# Patient Record
Sex: Female | Born: 1970 | Race: Black or African American | Hispanic: No | Marital: Married | Smoking: Never smoker
Health system: Southern US, Community
[De-identification: ages and names within clinical notes are randomized; demographics above are authoritative.]

## PROBLEM LIST (undated history)

## (undated) DIAGNOSIS — E785 Hyperlipidemia, unspecified: Secondary | ICD-10-CM

## (undated) DIAGNOSIS — D649 Anemia, unspecified: Secondary | ICD-10-CM

## (undated) DIAGNOSIS — T7840XA Allergy, unspecified, initial encounter: Secondary | ICD-10-CM

## (undated) DIAGNOSIS — F419 Anxiety disorder, unspecified: Secondary | ICD-10-CM

## (undated) DIAGNOSIS — F32A Depression, unspecified: Secondary | ICD-10-CM

## (undated) DIAGNOSIS — Z789 Other specified health status: Secondary | ICD-10-CM

## (undated) HISTORY — PX: COLONOSCOPY: SHX174

## (undated) HISTORY — DX: Anxiety disorder, unspecified: F41.9

## (undated) HISTORY — DX: Hyperlipidemia, unspecified: E78.5

## (undated) HISTORY — DX: Anemia, unspecified: D64.9

## (undated) HISTORY — DX: Depression, unspecified: F32.A

## (undated) HISTORY — PX: WISDOM TOOTH EXTRACTION: SHX21

## (undated) HISTORY — PX: CERVICAL CERCLAGE: SHX1329

## (undated) HISTORY — DX: Allergy, unspecified, initial encounter: T78.40XA

## (undated) HISTORY — DX: Other specified health status: Z78.9

---

## 2001-01-13 ENCOUNTER — Other Ambulatory Visit: Admission: RE | Admit: 2001-01-13 | Discharge: 2001-01-13 | Payer: Self-pay | Admitting: Obstetrics and Gynecology

## 2001-02-04 ENCOUNTER — Ambulatory Visit (HOSPITAL_COMMUNITY): Admission: RE | Admit: 2001-02-04 | Discharge: 2001-02-04 | Payer: Self-pay | Admitting: Obstetrics and Gynecology

## 2001-08-03 ENCOUNTER — Inpatient Hospital Stay (HOSPITAL_COMMUNITY): Admission: AD | Admit: 2001-08-03 | Discharge: 2001-08-06 | Payer: Self-pay | Admitting: Obstetrics and Gynecology

## 2001-09-14 ENCOUNTER — Other Ambulatory Visit: Admission: RE | Admit: 2001-09-14 | Discharge: 2001-09-14 | Payer: Self-pay | Admitting: Obstetrics and Gynecology

## 2003-02-21 ENCOUNTER — Other Ambulatory Visit: Admission: RE | Admit: 2003-02-21 | Discharge: 2003-02-21 | Payer: Self-pay | Admitting: Obstetrics and Gynecology

## 2004-11-05 ENCOUNTER — Ambulatory Visit: Payer: Self-pay | Admitting: Internal Medicine

## 2006-07-17 ENCOUNTER — Ambulatory Visit: Payer: Self-pay | Admitting: Internal Medicine

## 2006-08-06 ENCOUNTER — Ambulatory Visit: Payer: Self-pay | Admitting: Gastroenterology

## 2006-09-10 ENCOUNTER — Ambulatory Visit: Payer: Self-pay | Admitting: Gastroenterology

## 2007-08-10 ENCOUNTER — Ambulatory Visit: Payer: Self-pay | Admitting: Internal Medicine

## 2007-08-10 DIAGNOSIS — Z8719 Personal history of other diseases of the digestive system: Secondary | ICD-10-CM

## 2007-08-11 ENCOUNTER — Encounter: Payer: Self-pay | Admitting: Internal Medicine

## 2007-08-11 LAB — CONVERTED CEMR LAB: GC Probe Amp, Urine: NEGATIVE

## 2007-08-12 LAB — CONVERTED CEMR LAB
Cholesterol: 170 mg/dL (ref 0–200)
Eosinophils Absolute: 0.1 10*3/uL (ref 0.0–0.6)
Eosinophils Relative: 1.2 % (ref 0.0–5.0)
HCT: 33.7 % — ABNORMAL LOW (ref 36.0–46.0)
Hemoglobin: 11.8 g/dL — ABNORMAL LOW (ref 12.0–15.0)
Lymphocytes Relative: 24.4 % (ref 12.0–46.0)
MCV: 88.4 fL (ref 78.0–100.0)
Neutro Abs: 4.3 10*3/uL (ref 1.4–7.7)
Neutrophils Relative %: 70.8 % (ref 43.0–77.0)
Transferrin: 288.6 mg/dL (ref >=212.0)
WBC: 6.1 10*3/uL (ref 4.5–10.5)

## 2008-02-06 ENCOUNTER — Ambulatory Visit: Payer: Self-pay | Admitting: Family Medicine

## 2009-01-12 ENCOUNTER — Ambulatory Visit: Payer: Self-pay | Admitting: Internal Medicine

## 2009-08-22 ENCOUNTER — Ambulatory Visit: Payer: Self-pay | Admitting: Internal Medicine

## 2009-09-07 ENCOUNTER — Ambulatory Visit: Payer: Self-pay | Admitting: Internal Medicine

## 2009-09-12 ENCOUNTER — Encounter (INDEPENDENT_AMBULATORY_CARE_PROVIDER_SITE_OTHER): Payer: Self-pay | Admitting: *Deleted

## 2009-09-12 LAB — CONVERTED CEMR LAB
Basophils Relative: 0.5 % (ref 0.0–3.0)
Calcium: 8.7 mg/dL (ref 8.4–10.5)
Chloride: 106 meq/L (ref 96–112)
Creatinine, Ser: 0.9 mg/dL (ref 0.4–1.2)
Eosinophils Relative: 2.3 % (ref 0.0–5.0)
Ferritin: 4.7 ng/mL — ABNORMAL LOW (ref 10.0–291.0)
GFR calc non Af Amer: 89.12 mL/min (ref >60)
Iron: 49 ug/dL (ref 42–145)
LDL Cholesterol: 101 mg/dL — ABNORMAL HIGH (ref 0–99)
Lymphocytes Relative: 29.4 % (ref 12.0–46.0)
MCV: 87.4 fL (ref 78.0–100.0)
Monocytes Relative: 7 % (ref 3.0–12.0)
Neutrophils Relative %: 60.8 % (ref 43.0–77.0)
RBC: 3.84 M/uL — ABNORMAL LOW (ref 3.87–5.11)
Total CHOL/HDL Ratio: 3
Triglycerides: 63 mg/dL (ref 0.0–149.0)
WBC: 4.5 10*3/uL (ref 4.5–10.5)

## 2010-02-21 ENCOUNTER — Encounter (INDEPENDENT_AMBULATORY_CARE_PROVIDER_SITE_OTHER): Payer: Self-pay | Admitting: *Deleted

## 2010-02-21 ENCOUNTER — Ambulatory Visit: Payer: Self-pay | Admitting: Internal Medicine

## 2010-02-21 LAB — CONVERTED CEMR LAB: Rapid Strep: POSITIVE

## 2010-02-23 ENCOUNTER — Ambulatory Visit: Payer: Self-pay | Admitting: Internal Medicine

## 2010-02-23 ENCOUNTER — Encounter (INDEPENDENT_AMBULATORY_CARE_PROVIDER_SITE_OTHER): Payer: Self-pay | Admitting: *Deleted

## 2010-05-24 ENCOUNTER — Ambulatory Visit: Payer: Self-pay | Admitting: Family Medicine

## 2010-08-23 ENCOUNTER — Ambulatory Visit: Payer: Self-pay | Admitting: Internal Medicine

## 2010-08-30 LAB — CONVERTED CEMR LAB
ALT: 28 units/L (ref 0–35)
Calcium: 8.7 mg/dL (ref 8.4–10.5)
Cholesterol: 197 mg/dL (ref 0–200)
Eosinophils Relative: 2.6 % (ref 0.0–5.0)
Folate: 15.4 ng/mL
GFR calc non Af Amer: 100.16 mL/min (ref >60)
HCT: 34.5 % — ABNORMAL LOW (ref 36.0–46.0)
HDL: 61.8 mg/dL (ref >39.00)
Hemoglobin: 11.9 g/dL — ABNORMAL LOW (ref 12.0–15.0)
LDL Cholesterol: 117 mg/dL — ABNORMAL HIGH (ref 0–99)
Lymphocytes Relative: 60.9 % — ABNORMAL HIGH (ref 12.0–46.0)
Lymphs Abs: 2.6 10*3/uL (ref 0.7–4.0)
Monocytes Relative: 14.3 % — ABNORMAL HIGH (ref 3.0–12.0)
Neutro Abs: 0.9 10*3/uL — ABNORMAL LOW (ref 1.4–7.7)
Potassium: 4.4 meq/L (ref 3.5–5.1)
Saturation Ratios: 18.4 % — ABNORMAL LOW (ref 20.0–50.0)
Sodium: 135 meq/L (ref 135–145)
TSH: 3.42 microintl units/mL (ref 0.35–5.50)
Transferrin: 318.3 mg/dL (ref 212.0–360.0)
VLDL: 18.4 mg/dL (ref 0.0–40.0)
WBC: 4.2 10*3/uL — ABNORMAL LOW (ref 4.5–10.5)

## 2011-01-22 NOTE — Letter (Signed)
Summary: Work Dietitian at [employer]  [address] [place], [place] [address]   Phone: [phone]  Fax: [phone]    Today's Date: February 23, 2010  Name of Patient: Betty Hines  The above named patient had a medical visit today. Please take this into consideration when reviewing the time away from work/school.    Special Instructions:  [  ] None  [  ] To be off the remainder of today, returning to the normal work / school schedule tomorrow.  [  ] To be off until the next scheduled appointment on ______________________.  [x Other __________patient can return to work today ______________________________________________________ ________________________________________________________________________   Sincerely yours,   Shary Decamp

## 2011-01-22 NOTE — Assessment & Plan Note (Signed)
Summary: RIGHT ARM TINGLING--PH   Vital Signs:  Patient profile:   40 year old female Height:      65 inches Weight:      177 pounds BMI:     29.56 Pulse rate:   78 / minute BP sitting:   120 / 74  (left arm)  Vitals Entered By: Betty Hines (May 24, 2010 11:18 AM) CC: R hand tingling and going up arm some numbness    History of Present Illness: 40 yo woman here today for tingling in her R finger tips.  starts in the hand and radiates up into the upper arm.  occuring daily.  started 2 months ago.  no weakness.  R handed.  most noticeable when pt is 'still'.  no known injury.  no routine lifting.  no hx of anything similar.  hasn't tried anything for relief.  worse w/ clenching fist or carrying groceries or laundry basket.  reports she will sleep w/ R hand bent awkwardly under her face/chin.  Allergies (verified): 1)  ! Pcn  Review of Systems      See HPI  Physical Exam  General:  alert and well-developed.   Head:  Normocephalic and atraumatic without obvious abnormalities. No apparent alopecia or balding.  symmetric Pulses:  +2 carotid, radial, DP Extremities:  no C/C/E Neurologic:  alert & oriented X3, cranial nerves II-XII intact, strength normal in all extremities, sensation intact to light touch, and DTRs symmetrical and normal.    (-) phalen's and tinnel's.  + DeQuervains testing   Impression & Recommendations:  Problem # 1:  NUMBNESS, HAND (ICD-782.0) Assessment New  pt's tingling and numbness most noticeable over thumb pad and in first finger.  (-) carpal tunnel testing.  + DeQuervains.  start NSAID to decrease inflammation.  remainder of neuro exam WNL- TIA/CVA unlikely given duration of sxs, intermittant pattern, and normal neuro exam.  will refer to ortho in case NSAIDs don't provide relief.  reviewed red flags.  Pt expresses understanding and is in agreement w/ this plan.  Orders: Orthopedic Referral (Ortho)  Complete Medication List: 1)  Naprosyn 500 Mg  Tabs (Naproxen) .Marland Kitchen.. 1 two times a day x7-10 days and then as needed.  take w/ food  Patient Instructions: 1)  Someone will call you with your ortho appt 2)  This appears to be a nerve irritation and should improve 3)  Take the Naprosyn as directed- take w/ food to avoid stomach upset 4)  Try and avoid carrying heavy things on the R or sleeping w/ your hand tucked up under 5)  Call with any questions or concerns- especially weakness, change in speech, facial droop, abrupt numbness- or go to the ER 6)  Hang in there! Prescriptions: NAPROSYN 500 MG TABS (NAPROXEN) 1 two times a day x7-10 days and then as needed.  take w/ food  #60 x 1   Entered and Authorized by:   Neena Rhymes MD   Signed by:   Neena Rhymes MD on 05/24/2010   Method used:   Electronically to        [facility] (retail)       [address]       [place], [place]  [address]       Ph: [phone]       Fax: [phone]   RxID:   [id number]

## 2011-01-22 NOTE — Assessment & Plan Note (Signed)
Summary: strep throat/kdc   Vital Signs:  Patient profile:   40 year old female Height:      65 inches Weight:      171.2 pounds Temp:     100.9 degrees F BP sitting:   122 / 80  Vitals Entered By: Shary Decamp (February 21, 2010 11:10 AM) CC: ST, fever, achy, chills, sweats   History of Present Illness: as above sick x 2 days   Current Medications (verified): 1)  None  Allergies (verified): 1)  ! Pcn  Past History:  Past Medical History: Reviewed history from 08/22/2009 and no changes required. no Birth Control -- husband had a vasectomy  Past Surgical History: Reviewed history from 08/10/2007 and no changes required. Caesarean section, x 2   Social History: Reviewed history from 08/22/2009 and no changes required. Married two children Never Smoked Alcohol use-yes (occasionally) Drug use-no Regular exercise-yes (1 hour 7d/wk) caffeine use - 1 cup daily  Review of Systems       no drooling  General:  no exposure to sick people . ENT:  (-) RN. Resp:  no cough no chest congestion. GI:  Denies diarrhea, nausea, and vomiting. GU:  Denies dysuria.  Physical Exam  General:  alert and well-developed.   Ears:  R ear normal and L ear normal.   Nose:  not congested Mouth:  throat is red, no discharge the uvula seems edematous and enlarged throat is otherwise symmetric no stridor or drooling noted Lungs:  normal respiratory effort, no intercostal retractions, no accessory muscle use, and normal breath sounds.   Heart:  normal rate, regular rhythm, and no murmur.     Impression & Recommendations:  Problem # 1:  PHARYNGITIS (ICD-462) strep + patient has pharyngitis and a swollen uvula no signs of respiratory distress at this point she is allergic to penicillin plan: clinda, prednisone, see instructions   Her updated medication list for this problem includes:    Clindamycin Hcl 300 Mg Caps (Clindamycin hcl) ..... One tablet by mouth 4 times a  day  Complete Medication List: 1)  Clindamycin Hcl 300 Mg Caps (Clindamycin hcl) .... One tablet by mouth 4 times a day 2)  Prednisone (pak) 10 Mg Tabs (Prednisone) .... 3 by mouth for two days, 2x2, 1x2  Other Orders: Rapid Strep (04540)  Patient Instructions: 1)  take your medications as prescribed 2)  if you feel difficulty breathing or if the  throat gets more swollen:  go to  the ER immediately 3)  come back in two days  for a checkup Prescriptions: PREDNISONE (PAK) 10 MG TABS (PREDNISONE) 3 by mouth for two days, 2x2, 1x2  #12 x 0   Entered and Authorized by:   Elita Quick E. Paz MD   Signed by:   Nolon Rod. Paz MD on 02/21/2010   Method used:   Print then Give to Patient   RxID:   [id number] CLINDAMYCIN HCL 300 MG CAPS (CLINDAMYCIN HCL) one tablet by mouth 4 times a day  #40 x 0   Entered and Authorized by:   Nolon Rod. Paz MD   Signed by:   Nolon Rod. Paz MD on 02/21/2010   Method used:   Print then Give to Patient   RxID:   [id number]   Laboratory Results    Other Tests  Rapid Strep: positive  Kit Test Internal QC: Positive   (Normal Range: Negative)

## 2011-01-22 NOTE — Assessment & Plan Note (Signed)
Summary: 2 DAY CHECK//PH PER DR. PAZ   Vital Signs:  Patient profile:   40 year old female Height:      65 inches Weight:      171 pounds Temp:     97.9 degrees F BP sitting:   140 / 80  Vitals Entered By: Shary Decamp (February 23, 2010 11:32 AM) CC: feeling better   History of Present Illness: follow-up from last office visit, she was diagnosed with strep, her uvula  was edematous  Current Medications (verified): 1)  Clindamycin Hcl 300 Mg Caps (Clindamycin Hcl) .... One Tablet By Mouth 4 Times A Day 2)  Prednisone (Pak) 10 Mg Tabs (Prednisone) .... 3 By Mouth For Two Days, 2x2, 1x2  Allergies (verified): 1)  ! Pcn  Review of Systems       feels great no difficult to swallow or breathing  Physical Exam  General:  alert and well-developed.   Mouth:  throat is not reddened more,  uvula  is back to normal, no difficult to swallow or breathing noted   Impression & Recommendations:  Problem # 1:  PHARYNGITIS (ICD-462) improved continue with medications as planned,call if  problems Her updated medication list for this problem includes:    Clindamycin Hcl 300 Mg Caps (Clindamycin hcl) ..... One tablet by mouth 4 times a day  Complete Medication List: 1)  Clindamycin Hcl 300 Mg Caps (Clindamycin hcl) .... One tablet by mouth 4 times a day 2)  Prednisone (pak) 10 Mg Tabs (Prednisone) .... 3 by mouth for two days, 2x2, 1x2

## 2011-01-22 NOTE — Letter (Signed)
Summary: Work Dietitian at [employer]  [address] [place], [place] [address]   Phone: [phone]  Fax: [phone]    Today's Date: February 21, 2010  Name of Patient: Betty Hines  The above named patient had a medical visit today  Please take this into consideration when reviewing the time away from work/school.    Special Instructions:  [  ] None  [  x] To be off the remainder of today, returning to the normal work / school schedule Fri 02/23/10  [  ] To be off until the next scheduled appointment on ______________________.  [  ] Other ________________________________________________________________ ________________________________________________________________________   Sincerely yours,   Shary Decamp

## 2011-01-22 NOTE — Assessment & Plan Note (Signed)
Summary: cpx/cbs   Vital Signs:  Patient profile:   40 year old female Weight:      178.25 pounds Pulse rate:   80 / minute Pulse rhythm:   regular BP sitting:   122 / 76  (left arm) Cuff size:   regular  Vitals Entered By: Army Fossa CMA (August 23, 2010 10:33 AM) CC: CPX: fasting Comments Declines Flu Shot Not Had mamo Had PAP yesterday   History of Present Illness: CPX sees gyn  occationally her far vision is blurred Still having problems with controlling her weight  Current Medications (verified): 1)  None  Allergies: 1)  ! Pcn  Past History:  Past Medical History: Reviewed history from 08/22/2009 and no changes required. no Birth Control -- husband had a vasectomy  Past Surgical History: Reviewed history from 08/10/2007 and no changes required. Caesarean section, x 2   Family History: patient is adopted, she does know that some of her blood  related does know that she has a FH of colon cancer (age of dx?)  Social History: Married two children Never Smoked Alcohol use-yes (occasionally) Drug use-no Regular exercise- no diet-- "needs improvment" caffeine use - 1 cup daily occupation: HR manager   Review of Systems General:  Denies fatigue, fever, and weight loss. CV:  Denies chest pain or discomfort and swelling of feet. Resp:  Denies cough and shortness of breath. GI:  Denies bloody stools, diarrhea, nausea, and vomiting. GU:  Denies dysuria and hematuria; perods q 28 days, every other month is heavy  (very havy x 2 days, may last 7 days). Psych:  ++stress , occasionally depression, doesn't feel  needs treatment .  Physical Exam  General:  alert and well-developed.   Neck:  no masses and no thyromegaly.   Lungs:  normal respiratory effort, no intercostal retractions, no accessory muscle use, and normal breath sounds.   Heart:  normal rate, regular rhythm, and no murmur.   Abdomen:  soft, non-tender, no distention, no masses, no  guarding, and no rigidity.   Extremities:  no  lower extremity edema Neurologic:  alert & oriented X3, strength normal in all extremities, and gait normal.   Psych:  Cognition and judgment appear intact. Alert and cooperative with normal attention span and concentration.  not anxious appearing and not depressed appearing.  not anxious appearing and not depressed appearing.     Impression & Recommendations:  Problem # 1:  PREVENTIVE HEALTH CARE (ICD-V70.0) Td 07-2009 declined flu shot, explained benefits  labs  had a cscope----will get records   problems w/  occ. blurred vision, will see the eye doctor  problems loosing wt:--discussed healthy habits, calorie counting , WW?   h/o mild anemia---labs   Orders: Venipuncture (60630) TLB-BMP (Basic Metabolic Panel-BMET) (80048-METABOL) TLB-CBC Platelet - w/Differential (85025-CBCD) TLB-Lipid Panel (80061-LIPID) TLB-TSH (Thyroid Stimulating Hormone) (84443-TSH) TLB-ALT (SGPT) (84460-ALT) TLB-AST (SGOT) (84450-SGOT) TLB-IBC Pnl (Iron/FE;Transferrin) (83550-IBC) TLB-B12 + Folate Pnl (16010_93235-T73/UKG)  Other Orders: Specimen Handling (25427)  Patient Instructions: 1)  Please schedule a follow-up appointment in 1 year.         Appended Document: cpx/cbs Had  a colonoscopy Sept 2007, next due Sept 2012- with Dr. Christella Hartigan.

## 2011-05-07 NOTE — Assessment & Plan Note (Signed)
[facility]                                 ON-CALL NOTE   NAME:Betty Hines, Betty Hines                      MRN:          [id number]  DATE:02/06/2008                            DOB:          06-18-1971    Phone number [phone].   Patient of Dr. Drue Novel   The patient is concerned she might have strep throat, go to Saturday  clinic for eval at 9 a.m.     Jeffrey A. Tawanna Cooler, MD  Electronically Signed    JAT/MedQ  DD: 02/06/2008  DT: 02/08/2008  Job #: [id number]

## 2011-05-10 NOTE — Discharge Summary (Signed)
[facility]  Patient:    Betty Hines, Betty Hines Visit Number: [id number] MRN: [id number]          Service Type: OBS Location: 910A [id number] [id number] Attending Physician:  Lenoard Aden Dictated by:   Lenoard Aden, M.D. Admit Date:  08/03/2001 Discharge Date: 08/06/2001                             Discharge Summary  HOSPITAL COURSE:              The patient underwent an uncomplicated repeat low segment transverse cesarean section on August 03, 2001 without complications. Postoperative course was uncomplicated. Hemoglobin was 9.1 with hematocrit of 25.4. She tolerated a regular diet well and ambulated without difficulty. She was discharged to home on postoperative day #3.  DISCHARGE MEDICATIONS:        1. Motrin 600 mg one p.o. q.6h. p.r.n. pain.                               2. Tylox one p.o. q.4-6h. p.r.n. pain.                               3. Prenatal vitamins and iron supplementation.  DISCHARGE INSTRUCTIONS:       Discharge teaching was done.  DISCHARGE FOLLOWUP:           The patient is to follow up in the office in four to six weeks. Dictated by:   Lenoard Aden, M.D. Attending Physician:  Lenoard Aden DD:  08/23/01 TD:  08/23/01 Job: [id number]

## 2011-05-10 NOTE — H&P (Signed)
[facility]   Patient:    Betty Hines, Betty Hines                    MRN: [id number] Adm. Date:  [id number] Attending:  Lenoard Aden                         History and Physical  CHIEF COMPLAINT:              Previous cesarean section, for repeat.  HISTORY OF PRESENT ILLNESS:   The patient is a 40 year old, African-American female, G5, P44, EDD August 04, 2001, at 39-6/7 weeks for elective repeat C-section.  PAST MEDICAL HISTORY:         TAB x3. C-section x1. Cerclage x2.  ALLERGIES:                    Allergies to PENICILLIN.  MEDICATIONS:                  Prenatal vitamins.  FAMILY HISTORY:               Noncontributory.  SOCIAL HISTORY:               Noncontributory.  Father of the baby noted to have sickle cell trait.  PHYSICAL EXAMINATION:  GENERAL:                      On physical examination, she is a well-developed, well-nourished African-American female in no apparent distress.  HEENT:                        Normal.  LUNGS:                        Clear.  HEART:                        Regular rate and rhythm.  ABDOMEN:                      Soft, gravida, nontender.  Estimated fetal weight 9-1/2 to 10 pounds.  PELVIC:                       Cervix is fingertip, long, vertex, posterior.  EXTREMITIES:                  Reveal no cords.  NEUROLOGICAL:                 Examination is nonfocal.  IMPRESSION:                   1. Term intrauterine pregnancy.                               2. Presumed large for gestational age.                               3. Previous cesarean section, for repeat.  PLAN:                         Proceed with elective repeat C-section. Risks of anesthesia, infection, bleeding, injury to abdominal organs, and need for repair is discussed.  Delayed versus  immediate complications to include bowel and bladder range are noted.  The patient acknowledges and desires to proceed. DD:  08/03/01 TD:  08/03/01 Job:  [id number]

## 2011-05-10 NOTE — H&P (Signed)
[facility] [place]  Patient:    Betty Hines, Betty Hines                    MRN: [id number] Adm. Date:  [id number] Attending:  Lenoard Aden Dictator:   Lenoard Aden, M.D. [facility]   History and Physical  CHIEF COMPLAINT:              History of incompetent cervix.  HISTORY OF PRESENT ILLNESS:   The patient is a 40 year old black female G4, P1-0-2-1, with a history of an emergent cervical cerclage placement in her prior pregnancy due to progressive cervical change at 20 weeks, who now presents for elective cerclage placement at 14 weeks.  PAST MEDICAL HISTORY:         Remarkable for no medial or surgical hospitalizations except for pregnancy related.  She had a C-section for macrosomia in 1999 and also had cerclage placement in September 1998.  Her husband has sickle cell trait.  The patient is negative for sickle cell trait.  OBSTETRICAL HISTORY:          Remarkable for a first trimester D&E in March 1991, with a repeat D&C secondary to hemorrhage, elective abortion in 1995. She had a previous C-section in 1999 which was uncomplicated.  FAMILY HISTORY:               Unknown due to her adopted status.  Her husband has a history of G6PD deficiency and sickle cell trait.  LABORATORY DATA:              Current lab data includes a blood type of A negative, Rh antibody negative, sickle cell trait negative, VDRL nonreactive, hepatitis B surface antigen negative, HIV nonreactive.  PHYSICAL EXAMINATION:  GENERAL:                      She is a well-developed, well-nourished black female in no apparent distress.  HEENT:                        Normal.  LUNGS:                        Clear.  HEART:                        Regular rate and rhythm.  ABDOMEN:                      Soft, nontender.  PELVIC EXAM:                  Reveals a cervical length of about 4 cm with a gravid uterus to 14-15 weeks and fetal heart tones  noted.  EXTREMITIES:                  Reveals no cords.  NEUROLOGIC EXAM:              Nonfocal.  IMPRESSION:                   1. A 14+ intrauterine pregnancy.                               2. History of cervical incompetence, status  post emergency cerclage placement.                               3. Rh negative.  PLAN:                         Proceed with McDonald elective cervical cerclage.  The risks of anesthesia, infection, bleeding, pregnancy loss rates of less than 5% discussed.  The patient acknowledges and desires to proceed. DD:  02/04/01 TD:  02/04/01 Job: [id number]

## 2011-05-10 NOTE — Op Note (Signed)
[facility] [place]  Patient:    Betty Hines, Betty Hines                    MRN: [id number] Proc. Date: 08/03/01 Adm. Date:  [id number] Attending:  Lenoard Aden                           Operative Report  PREOPERATIVE DIAGNOSIS:       39-week intrauterine pregnancy.  Presume LGA. Previous cesarean section.  POSTOPERATIVE DIAGNOSIS:      39-week intrauterine pregnancy.  Presume LGA. Previous cesarean section.  OPERATION:                    Repeat low transverse cesarean section.  SURGEON:                      Lenoard Aden, M.D.  ASSISTANT:                    Marina Gravel, M.D.  ANESTHESIA:                   Spinal.  ESTIMATED BLOOD LOSS:         1000 cc.  COMPLICATIONS:                None.  DRAINS:                       Foley.  COUNTS:                       Correct.  The patient to the recovery room in good condition.  DESCRIPTION OF PROCEDURE:     After being apprised of the risks of anesthesia, infection, bleeding, injury to abdominal organs with need for repair, the patient was brought to the operating room where she was administered spinal anesthetic without complications, and prepped and draped in the usual sterile fashion.  Foley catheter placed.  After achieving adequate anesthesia, dilute Marcaine solution 10 cc placed in the previous incision and incision made with the scalpel, carried down to the fascia, which was nicked in the midline and opened transversely using Mayo scissors.  The rectus muscles were dissected sharply in the midline.  Peritoneal entry done sharply.  Bladder blade placed. The bladder flap is sharply dissected off the lower uterine segment, uterine incision is incised.  Clear fluid noted.  Delivery of a fullterm living female from the left occipitotransverse position 10 pounds even, handed to pediatricians in attendance.  Apgars 8 and 9 noted.  The placenta delivered manually intact.  Three vessel cord noted.  Cervix  dilated using sponge forcep. Uterus curetted using a dry lap pack and exteriorized.  Normal tubes and ovaries noted.  Uterine incision closed with 0 Monocryl in continuous running fashion with placement of a second imbricating layer.  There is bleeding from the left lower uterine segment.  An OLeary stitch is placed using a 0 Monocryl suture and good hemostasis is achieved.  Inspection notes no evidence of a hematoma nor extension into the broad ligament.  This is reinspected at the end of the second imbricating layer which is placed using a 0 Monocryl suture.  Rectus muscles reapproximated in the lower midline using a 0 Monocryl suture and reinspection reveals no evidence of hematoma formation at the left portion of the lower uterine incision.  At this time  pericolic gutters are irrigated.  All blood clot subsequently removed.  The fascia then closed using #1 Vicryl in continuous running fashion.  Skin closed using staples.  Good hemostasis noted.  The patient tolerated the procedure well and was transferred to the recovery room in good condition. DD:  08/03/01 TD:  08/03/01 Job: [id number]

## 2011-05-10 NOTE — Op Note (Signed)
[facility] of   Patient:    Betty Hines, Betty Hines                    MRN: [id number] Proc. Date: 02/04/01 Adm. Date:  [id number] Attending:  Lenoard Aden CC:         [facility]   Operative Report  PREOPERATIVE DIAGNOSES:       1. Intrauterine pregnancy at 14-1/2 weeks.                               2. History of cervical incompetence.  POSTOPERATIVE DIAGNOSES:      1. Intrauterine pregnancy at 14-1/2 weeks.                               2. History of cervical incompetence.  PROCEDURE:                    McDonalds cervical cerclage.  SURGEON:                      Lenoard Aden, M.D.  ASSISTANT:                    Marina Gravel, M.D.  ANESTHESIA:                   Spinal by Pamalee Leyden.  ESTIMATED BLOOD LOSS:         Less than 50 cc.  COMPLICATIONS:                None.  DRAINS:                       None.  COUNTS:                       Correct.  DISPOSITION:                  To recovery in good condition.  DESCRIPTION OF PROCEDURE:     After being apprised of the risks of anesthesia, infection, bleeding and the possible risks of miscarriage being less than 5%, consent was signed.  The patient was brought to the operating room, where she was administered a spinal anesthesia without complications.  Her feet were placed in the yellow fin stirrups.  The patient was prepped and draped in the usual sterile fashion and catheterized until the bladder was empty. Examination under anesthesia revealed a 3.5 to 4 cm patulous cervix.  Fetal heart tones were heard preprocedure.  At this time, a #5 Ethibond suture was used to place a McDonald cervical cerclage, taking purchase of the cervix with small ring forceps and placing the suture at the cervicovaginal junction at the level of the internal os from 1 to 11 oclock, then 11 to 8 oclock, 7 oclock to 4 oclock and 4 oclock back to 1 oclock.  :The suture was tied down over a Prolene tie.  Both sutures  were tied.  Good purchase of the cervix was noted.  No tearing and no pulling through of the tissue was appreciated. The cervical os appeared closed internally and externally.  Good hemostasis was achieved.  All instruments were removed.  The patient tolerated the procedure well and was transferred to the recovery room in good condition. DD:  02/04/01 TD:  02/04/01  Job: [id number]

## 2011-05-10 NOTE — Assessment & Plan Note (Signed)
[facility]                           GASTROENTEROLOGY OFFICE NOTE   NAME:Betty Hines                      MRN:          [id number]  DATE:08/06/2006                            DOB:          [birth year]    REFERRED BY:  Dr. Willow Ora.   REASON FOR REFERRAL:  Dr. Drue Novel asked me to evaluate Betty Hines in  consultation regarding strong family history of colon cancer.   HISTORY OF PRESENT ILLNESS:  Betty Hines is a very pleasant 40 year old  woman who has had several second degree relatives die from colon cancer.  Specifically on her mother's side she has had three aunts and one uncle all  pass away because of colon cancer. She believes this was in their early 73s.  She herself has had minor intermittent bright red blood per rectum and is  bothered by rather chronic constipation. She has tried fiber supplements  periodically but not on a daily basis. She has tried cleansing enemas, and  none have seemed to help her constipation.   REVIEW OF SYSTEMS:  Noted for slow 5 pound weight loss in the past year. The  rest of the Review of Systems is essentially normal and is available on her  nursing intake sheet.   PAST MEDICAL HISTORY:  C-section x2.   CURRENT MEDICATIONS:  Probiotic, multivitamin, vitamin C, fish oil.   ALLERGIES:  PENICILLIN.   SOCIAL HISTORY:  Married with two children. 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 Nonsmoker, nondrinker.   FAMILY HISTORY:  Mother with diabetes. Mother with heart disease. Three  maternal aunts and one maternal uncle with colon cancer. Other maternal  aunts with colon polyps.   PHYSICAL EXAMINATION:  VITAL SIGNS:  5 feet 4-1/2 inches, 169 pounds, blood  pressure 106/58, pulse 72.  CONSTITUTIONAL:  Generally well-appearing.  NEUROLOGICAL:  Alert and in no distress.  HEENT:  Extraocular movements intact, mouth, oropharynx moist. No lesions.  NECK:  Supple, no  lymphadenopathy.  CARDIOVASCULAR:  Heart regular rate and rhythm.  LUNGS:  Clear to auscultation bilaterally.  ABDOMEN:  Soft, nontender, nondistended. Normal bowel sounds.  EXTREMITIES:  No lower extremity edema.  SKIN:  No rash, no lesions on visible extremities.   ASSESSMENT AND PLAN:  A 40 year old woman with strong family history of  colon cancer, intermittent bright red blood per rectum. Her minor rectal  bleeding is likely hemorrhoidal. It does seem to be around the times of  constipation. That being said for that reason we should pursue a full  screening colonoscopy plus she has a very strong family history for colon  cancer that has led to the death of four of her mom's siblings. She had  recent labs showing that she is not anemic, and she has a normal basic  metabolic profile so I see no reason to do any further lab testing or  imaging studies. We will proceed with full colonoscopy at Ms. Seigler'  convenience.  Rachael Fee, MD   DPJ/MedQ  DD:  08/06/2006  DT:  08/06/2006  Job #:  [id number]   cc:   Willow Ora, MD

## 2011-08-27 ENCOUNTER — Encounter: Payer: Self-pay | Admitting: Internal Medicine

## 2011-08-27 ENCOUNTER — Ambulatory Visit (INDEPENDENT_AMBULATORY_CARE_PROVIDER_SITE_OTHER): Payer: PRIVATE HEALTH INSURANCE | Admitting: Internal Medicine

## 2011-08-27 DIAGNOSIS — Z0000 Encounter for general adult medical examination without abnormal findings: Secondary | ICD-10-CM | POA: Insufficient documentation

## 2011-08-27 LAB — CBC WITH DIFFERENTIAL/PLATELET
Basophils Relative: 0.3 % (ref 0.0–3.0)
Eosinophils Relative: 2.4 % (ref 0.0–5.0)
HCT: 34.1 % — ABNORMAL LOW (ref 36.0–46.0)
Hemoglobin: 11.4 g/dL — ABNORMAL LOW (ref 12.0–15.0)
MCV: 91.9 fl (ref 78.0–100.0)
Monocytes Absolute: 0.5 10*3/uL (ref 0.1–1.0)
Neutrophils Relative %: 62 % (ref 43.0–77.0)
RBC: 3.71 Mil/uL — ABNORMAL LOW (ref 3.87–5.11)
WBC: 5.7 10*3/uL (ref 4.5–10.5)

## 2011-08-27 LAB — COMPREHENSIVE METABOLIC PANEL
ALT: 16 U/L (ref 0–35)
AST: 19 U/L (ref 0–37)
Albumin: 3.8 g/dL (ref 3.5–5.2)
Alkaline Phosphatase: 43 U/L (ref 39–117)
Glucose, Bld: 91 mg/dL (ref 70–99)
Potassium: 3.9 mEq/L (ref 3.5–5.1)
Sodium: 138 mEq/L (ref 135–145)
Total Bilirubin: 0.7 mg/dL (ref 0.3–1.2)
Total Protein: 7.2 g/dL (ref 6.0–8.3)

## 2011-08-27 LAB — LIPID PANEL
Total CHOL/HDL Ratio: 3
Triglycerides: 50 mg/dL (ref 0.0–149.0)

## 2011-08-27 LAB — TSH: TSH: 2.78 u[IU]/mL (ref 0.35–5.50)

## 2011-08-27 NOTE — Assessment & Plan Note (Addendum)
Td  8 2010 Gyn Dr Rosemary Holms Cscope 2007, normal, next per GI  (pt plans to contact them, + FH)  Diet-exercise discussed Labs including a B12 Ca and vit D OTC  encouraged

## 2011-08-27 NOTE — Progress Notes (Signed)
  Subjective:    Patient ID: Betty Hines, female    DOB: 1971-04-19, 40 y.o.   MRN: [id number]  HPI Physical exam, doing well.  Past Medical History  Diagnosis Date  . No known problems     Past Surgical History  Procedure Date  . Cesarean section     x2    Family History: patient is adopted, she does know that some of her blood  related does know that she has a FH of colon cancer (age of dx?)  Social History: Married but separated, two children Never Smoked Alcohol -- rarely  Drug use-no Regular exercise-- active, exercise ~ 3/week diet-- good and bad days  occupation: HR manager   Review of Systems Denies any nausea, vomiting, diarrhea, no blood in the stools. Denies any dysuria or gross hematuria. Emotionally doing well. Exercises frequently, no exertional chest pain , has had some dyspnea on exertion but has noted that is improving as she gets more active. Bilateral-upper chest pain while doing sit ups one time, but again no exertional chest pain.    Objective:   Physical Exam  Constitutional: She is oriented to person, place, and time. She appears well-developed and well-nourished. No distress.  HENT:  Head: Normocephalic and atraumatic.  Neck:       Small, symmetric, nontender thyroid gland  Cardiovascular: Normal rate, regular rhythm and normal heart sounds.   No murmur heard. Pulmonary/Chest: Effort normal and breath sounds normal. No respiratory distress. She has no wheezes. She has no rales.  Abdominal: Soft. Bowel sounds are normal. She exhibits no distension. There is no tenderness. There is no rebound and no guarding.  Musculoskeletal: She exhibits no edema.  Neurological: She is alert and oriented to person, place, and time.  Skin: She is not diaphoretic.  Psychiatric: She has a normal mood and affect. Her behavior is normal. Judgment and thought content normal.          Assessment & Plan:

## 2011-09-04 ENCOUNTER — Encounter: Payer: Self-pay | Admitting: Gastroenterology

## 2012-07-23 ENCOUNTER — Other Ambulatory Visit: Payer: Self-pay | Admitting: Obstetrics and Gynecology

## 2012-07-23 DIAGNOSIS — Z1231 Encounter for screening mammogram for malignant neoplasm of breast: Secondary | ICD-10-CM

## 2012-07-28 ENCOUNTER — Ambulatory Visit (INDEPENDENT_AMBULATORY_CARE_PROVIDER_SITE_OTHER): Payer: PRIVATE HEALTH INSURANCE

## 2012-07-28 DIAGNOSIS — Z1231 Encounter for screening mammogram for malignant neoplasm of breast: Secondary | ICD-10-CM

## 2012-09-11 ENCOUNTER — Encounter: Payer: Self-pay | Admitting: Gastroenterology

## 2012-09-21 ENCOUNTER — Encounter: Payer: Self-pay | Admitting: Internal Medicine

## 2012-09-21 ENCOUNTER — Ambulatory Visit (INDEPENDENT_AMBULATORY_CARE_PROVIDER_SITE_OTHER): Payer: PRIVATE HEALTH INSURANCE | Admitting: Internal Medicine

## 2012-09-21 VITALS — BP 118/76 | HR 68 | Temp 98.7°F | Ht 65.0 in | Wt 175.0 lb

## 2012-09-21 DIAGNOSIS — Z0000 Encounter for general adult medical examination without abnormal findings: Secondary | ICD-10-CM

## 2012-09-21 NOTE — Progress Notes (Signed)
  Subjective:    Patient ID: Betty Hines, female    DOB: 04-Jan-1971, 41 y.o.   MRN: [id number]  HPI CPX  Past medical history None  Past surgical history C sections x 2   Family History: patient is adopted, she does know that some of her blood  related does know that she has a FH of colon cancer (age of dx?)  Social History: Married but separated, two children Never Smoked Alcohol -- rarely   Drug use-no Regular exercise-- active, exercise ~ 5/week diet-- good and bad days   occupation: HR manager    Review of Systems In general doing well. No chest pain shortness or breath No nausea, vomiting, diarrhea or blood in the stools. A week ago developed a cough, mostly dry, increase when she tries to take a deep breath, associated with mild nasal discharge. Denies fever chills or sore throat. Some B ear discomfort. Emotionally  is doing okay, from time to time she gets upset but again in general doing well. Periods are regular and actually less heavy than before    Objective:   Physical Exam General -- alert, well-developed, and well-nourished.   Neck --no thyromegaly , no LADs HEENT -- TMs normal, throat w/o redness, face symmetric and not tender to palpation, nose slt congested  Lungs -- normal respiratory effort, no intercostal retractions, no accessory muscle use, and normal breath sounds.   Heart-- normal rate, regular rhythm, no murmur, and no gallop.   Abdomen--soft, non-tender, no distention, no masses, no HSM, no guarding, and no rigidity.   Extremities-- no pretibial edema bilaterally  psych-- Cognition and judgment appear intact. Alert and cooperative with normal attention span and concentration.  not anxious appearing and not depressed appearing.     Assessment & Plan:

## 2012-09-21 NOTE — Patient Instructions (Addendum)
Come back fasting: FLP, CBC-- dx annual exam ---- Please contact Dr. Christella Hartigan office for another colonoscopy ---- Next visit in one year

## 2012-09-21 NOTE — Assessment & Plan Note (Addendum)
Td  8- 2010; declined a flu shots before  --Gyn Dr Rosemary Holms --Cscope 2007, normal,  plans to call Dr. Christella Hartigan and schedule a colonoscopy Diet discussed, information provided, she is very active, goes to the gym 5 times a week. Labs -- Chol and CBC Ca and vit D OTC  encouraged

## 2012-09-24 ENCOUNTER — Other Ambulatory Visit: Payer: PRIVATE HEALTH INSURANCE

## 2012-12-19 ENCOUNTER — Emergency Department (INDEPENDENT_AMBULATORY_CARE_PROVIDER_SITE_OTHER)
Admission: EM | Admit: 2012-12-19 | Discharge: 2012-12-19 | Disposition: A | Discharge status: Home / Self Care | Payer: PRIVATE HEALTH INSURANCE | Source: Home / Self Care | Attending: Family Medicine | Admitting: Family Medicine

## 2012-12-19 ENCOUNTER — Encounter: Payer: Self-pay | Admitting: *Deleted

## 2012-12-19 DIAGNOSIS — J111 Influenza due to unidentified influenza virus with other respiratory manifestations: Secondary | ICD-10-CM

## 2012-12-19 DIAGNOSIS — J029 Acute pharyngitis, unspecified: Secondary | ICD-10-CM

## 2012-12-19 LAB — POCT RAPID STREP A (OFFICE): Rapid Strep A Screen: NEGATIVE

## 2012-12-19 MED ORDER — OSELTAMIVIR PHOSPHATE 75 MG PO CAPS: 75.0000 mg | ORAL_CAPSULE | Freq: Two times a day (BID) | ORAL | Status: DC

## 2012-12-19 MED ORDER — BENZONATATE 200 MG PO CAPS: 200.0000 mg | ORAL_CAPSULE | Freq: Every day | ORAL | Status: DC

## 2012-12-19 NOTE — ED Provider Notes (Signed)
History     CSN: [id number]  Arrival date & time 12/19/12  1152   First MD Initiated Contact with Patient 12/19/12 1324      Chief Complaint  Patient presents with  . Headache  . Pressure Behind the Eyes  . Sore Throat      HPI Comments: Patient complains of 2 day history of of flu-like symptoms including sore throat, eye pressure, congestion, cough, chills, and fatigue.   The history is provided by the patient.    Past Medical History  Diagnosis Date  . No known problems     Past Surgical History  Procedure Date  . Cesarean section     x2    Family History  Problem Relation Age of Onset  . Colon cancer      2 aunts and 1 uncle , youngest index case??  . Breast cancer Neg Hx   . Coronary artery disease Neg Hx   . Diabetes Neg Hx     History  Substance Use Topics  . Smoking status: Never Smoker   . Smokeless tobacco: Not on file  . Alcohol Use: No    OB History    Grav Para Term Preterm Abortions TAB SAB Ect Mult Living                  Review of Systems + sore throat + cough No pleuritic pain ? wheezing + nasal congestion ? post-nasal drainage No sinus pain/pressure No itchy/red eyes ? earache No hemoptysis No SOB No fever, + chills No nausea No vomiting No abdominal pain No diarrhea No urinary symptoms No skin rashes + fatigue No myalgias + headache   Allergies  Penicillins  Home Medications   Current Outpatient Rx  Name  Route  Sig  Dispense  Refill  . BENZONATATE 200 MG PO CAPS   Oral   Take 1 capsule (200 mg total) by mouth at bedtime. Take as needed for cough   12 capsule   0   . NON FORMULARY      NONE TO REPORT.          . OSELTAMIVIR PHOSPHATE 75 MG PO CAPS   Oral   Take 1 capsule (75 mg total) by mouth every 12 (twelve) hours.   10 capsule   0     BP 117/78  Pulse 103  Temp 98.7 F (37.1 C) (Oral)  Resp 18  Ht 5\' 4"  (1.626 m)  Wt 177 lb (80.287 kg)  BMI 30.38 kg/m2  SpO2 96%  Physical  Exam Nursing notes and Vital Signs reviewed. Appearance:  Patient appears stated age, and in no acute distress.  Patient is obese (BMI 30.4) Eyes:  Pupils are equal, round, and reactive to light and accomodation.  Extraocular movement is intact.  Conjunctivae are not inflamed  Ears:  Canals normal.  Tympanic membranes normal.  Nose:  Mildly congested turbinates.  No sinus tenderness.   Pharynx:  Normal Neck:  Supple.  Slightly tender shotty posterior nodes are palpated bilaterally  Lungs:  Clear to auscultation.  Breath sounds are equal.  Heart:  Regular rate and rhythm without murmurs, rubs, or gallops.  Abdomen:  Nontender without masses or hepatosplenomegaly.  Bowel sounds are present.  No CVA or flank tenderness.  Extremities:  No edema.  No calf tenderness Skin:  No rash present.   ED Course  Procedures  none   Labs Reviewed  POCT RAPID STREP A (OFFICE) negative      1.  Acute pharyngitis   2. Influenza-like illness       MDM  Begin Tamiflu.  Prescription written for Benzonatate [facility]) to take at bedtime for night-time cough.  Take Mucinex D (guaifenesin with decongestant) twice daily for congestion.  Increase fluid intake, rest. May use Afrin nasal spray (or generic oxymetazoline) twice daily for about 5 days.  Also recommend using saline nasal spray several times daily and saline nasal irrigation (AYR is a common brand) Stop all antihistamines for now, and other non-prescription cough/cold preparations. May take Ibuprofen 200mg , 4 tabs every 8 hours for sore throat, body aches, etc. Follow-up with family doctor if not improving about one week         Lattie Haw, MD 12/22/12 [phone]

## 2012-12-19 NOTE — ED Notes (Signed)
Patient c/o sore throat, eye pressure, congestion, cough x 2 days. Has not tried anything OTC.

## 2013-06-24 ENCOUNTER — Other Ambulatory Visit: Payer: Self-pay | Admitting: Obstetrics and Gynecology

## 2013-06-24 DIAGNOSIS — Z1231 Encounter for screening mammogram for malignant neoplasm of breast: Secondary | ICD-10-CM

## 2013-08-03 ENCOUNTER — Ambulatory Visit: Payer: PRIVATE HEALTH INSURANCE

## 2013-08-05 ENCOUNTER — Ambulatory Visit (INDEPENDENT_AMBULATORY_CARE_PROVIDER_SITE_OTHER): Payer: PRIVATE HEALTH INSURANCE

## 2013-08-05 DIAGNOSIS — Z1231 Encounter for screening mammogram for malignant neoplasm of breast: Secondary | ICD-10-CM

## 2013-09-22 ENCOUNTER — Encounter: Payer: Self-pay | Admitting: Internal Medicine

## 2013-09-22 ENCOUNTER — Telehealth: Payer: Self-pay

## 2013-09-22 NOTE — Telephone Encounter (Signed)
HM UTD WE flu vaccine Meds reconciled, allergies and pharmacy verfied

## 2013-09-23 ENCOUNTER — Encounter: Payer: PRIVATE HEALTH INSURANCE | Admitting: Internal Medicine

## 2013-09-23 DIAGNOSIS — Z0289 Encounter for other administrative examinations: Secondary | ICD-10-CM

## 2016-07-29 ENCOUNTER — Emergency Department (INDEPENDENT_AMBULATORY_CARE_PROVIDER_SITE_OTHER)
Admission: EM | Admit: 2016-07-29 | Discharge: 2016-07-29 | Disposition: A | Discharge status: Home / Self Care | Payer: BLUE CROSS/BLUE SHIELD | Source: Home / Self Care | Attending: Family Medicine | Admitting: Family Medicine

## 2016-07-29 ENCOUNTER — Encounter: Payer: Self-pay | Admitting: *Deleted

## 2016-07-29 ENCOUNTER — Emergency Department (INDEPENDENT_AMBULATORY_CARE_PROVIDER_SITE_OTHER): Payer: BLUE CROSS/BLUE SHIELD

## 2016-07-29 DIAGNOSIS — K921 Melena: Secondary | ICD-10-CM | POA: Diagnosis not present

## 2016-07-29 DIAGNOSIS — R1030 Lower abdominal pain, unspecified: Secondary | ICD-10-CM | POA: Diagnosis not present

## 2016-07-29 DIAGNOSIS — N390 Urinary tract infection, site not specified: Secondary | ICD-10-CM | POA: Diagnosis not present

## 2016-07-29 DIAGNOSIS — R300 Dysuria: Secondary | ICD-10-CM

## 2016-07-29 DIAGNOSIS — D649 Anemia, unspecified: Secondary | ICD-10-CM

## 2016-07-29 LAB — POCT URINALYSIS DIP (MANUAL ENTRY)
Bilirubin, UA: NEGATIVE
Glucose, UA: NEGATIVE
Nitrite, UA: POSITIVE — AB
Protein Ur, POC: >=300 — AB
Spec Grav, UA: >=1.03 (ref 1.005–1.03)
Urobilinogen, UA: 0.2 (ref 0–1)
pH, UA: 6.5 (ref 5–8)

## 2016-07-29 LAB — POCT CBC W AUTO DIFF (K'VILLE URGENT CARE)

## 2016-07-29 LAB — COMPLETE METABOLIC PANEL WITH GFR
ALT: 15 U/L (ref 6–29)
AST: 15 U/L (ref 10–35)
Albumin: 4.1 g/dL (ref 3.6–5.1)
Alkaline Phosphatase: 44 U/L (ref 33–115)
BUN: 8 mg/dL (ref 7–25)
CO2: 23 mmol/L (ref 20–31)
Calcium: 9.2 mg/dL (ref 8.6–10.2)
Chloride: 104 mmol/L (ref 98–110)
Creat: 0.79 mg/dL (ref 0.50–1.10)
GFR, Est African American: >89 mL/min (ref >=60)
GFR, Est Non African American: 89 mL/min (ref >=60)
Glucose, Bld: 100 mg/dL — ABNORMAL HIGH (ref 65–99)
Potassium: 4.8 mmol/L (ref 3.5–5.3)
Sodium: 136 mmol/L (ref 135–146)
Total Bilirubin: 0.6 mg/dL (ref 0.2–1.2)
Total Protein: 7.2 g/dL (ref 6.1–8.1)

## 2016-07-29 MED ORDER — IOPAMIDOL (ISOVUE-300) INJECTION 61%: 100.0000 mL | Freq: Once | INTRAVENOUS | Status: AC | PRN

## 2016-07-29 MED ORDER — SULFAMETHOXAZOLE-TRIMETHOPRIM 800-160 MG PO TABS: 1.0000 | ORAL_TABLET | Freq: Two times a day (BID) | ORAL | 0 refills | Status: AC

## 2016-07-29 NOTE — ED Triage Notes (Signed)
Pt reports yesterday developed suprapubic abdominal pain, scant hematuria. Today after having a bowel movement noticed blood in her stool and c/o chills. H/o 2 hemorrhoids. Denies n/v/d. No otc meds taken.

## 2016-07-29 NOTE — ED Notes (Signed)
Patient left @ 1:15pm to radiology to go prepare for CT scan with contrast.

## 2016-07-29 NOTE — ED Provider Notes (Signed)
CSN: [id number]     Arrival date & time 07/29/16  1210 History   First MD Initiated Contact with Patient 07/29/16 1244     Chief Complaint  Patient presents with  . Abdominal Pain  . Hematuria  . Dysuria   (Consider location/radiation/quality/duration/timing/severity/associated sxs/prior Treatment) HPI  Betty Hines is a 45 y.o. female presenting to UC with c/o dysuria with hematuria as well as 1 episode of blood in her stool this morning.  Pt notes the blood was red on her stool as well as some on the tissue. When asked if it was a lot pt stated "enough to make me concerned to come in."  She does note she strained some at the beginning of her bowel movement this morning but does not feel constipated.  She is c/o lower abdominal pressure and throbbing, 8/10. No medications tried PTA.  She reports having a colonoscopy about 5 years ago due to family hx of colorectal cancer and notes she is due to have another exam.  Her exam 5 years ago was normal. She does have a primary care provider she f/u with regularly and has been advised she is mildly anemic but is not on iron pills. Pt reports hx of 2 hemorrhoids in the past. Denies fever, n/v/d but has had a few chills. Denies hx of diverticulitis.    Past Medical History:  Diagnosis Date  . No known problems    Past Surgical History:  Procedure Laterality Date  . CESAREAN SECTION     x2   Family History  Problem Relation Age of Onset  . Colon cancer      2 aunts and 1 uncle , youngest index case??  . Breast cancer Neg Hx   . Coronary artery disease Neg Hx   . Diabetes Neg Hx    Social History  Substance Use Topics  . Smoking status: Never Smoker  . Smokeless tobacco: Never Used  . Alcohol use No   OB History    No data available     Review of Systems  Constitutional: Positive for chills. Negative for appetite change, diaphoresis, fatigue, fever and unexpected weight change.  Respiratory: Negative for chest tightness and  shortness of breath.   Cardiovascular: Negative for chest pain and palpitations.  Gastrointestinal: Positive for abdominal pain and blood in stool. Negative for anal bleeding, constipation, diarrhea, nausea, rectal pain and vomiting.  Genitourinary: Positive for dysuria, hematuria and pelvic pain. Negative for decreased urine volume, frequency, urgency, vaginal bleeding, vaginal discharge and vaginal pain.  Musculoskeletal: Negative for back pain and myalgias.  Neurological: Negative for dizziness, syncope and light-headedness.    Allergies  Penicillins  Home Medications   Prior to Admission medications   Medication Sig Start Date End Date Taking? Authorizing Provider  sulfamethoxazole-trimethoprim (BACTRIM DS,SEPTRA DS) 800-160 MG tablet Take 1 tablet by mouth 2 (two) times daily. 07/29/16 08/05/16  Noland Fordyce, PA-C   Meds Ordered and Administered this Visit  Medications - No data to display  BP 138/84 (BP Location: Left Arm)   Pulse 85   Temp 98.6 F (37 C) (Oral)   Resp 14   Ht 5\' 5"  (1.651 m)   Wt 192 lb (87.1 kg)   LMP 07/14/2016   SpO2 99%   BMI 31.95 kg/m  No data found.   Physical Exam  Constitutional: She appears well-developed and well-nourished. No distress.  Pt sitting on exam bed, NAD  HENT:  Head: Normocephalic and atraumatic.  Mouth/Throat: Oropharynx is clear and  moist.  Eyes: Conjunctivae are normal. No scleral icterus.  Neck: Normal range of motion.  Cardiovascular: Normal rate, regular rhythm and normal heart sounds.   Pulmonary/Chest: Effort normal and breath sounds normal. No respiratory distress. She has no wheezes. She has no rales. She exhibits no tenderness.  Abdominal: Soft. Bowel sounds are normal. She exhibits no distension and no mass. There is tenderness. There is no rebound, no guarding and no CVA tenderness.    Soft, non-distended, normal bowel sounds. Tenderness to lower abdomen and suprapubic region. No rebound, guarding or masses.     Musculoskeletal: Normal range of motion.  Neurological: She is alert.  Skin: Skin is warm and dry. She is not diaphoretic.  Nursing note and vitals reviewed.   Urgent Care Course   Clinical Course    Procedures (including critical care time)  Labs Review Labs Reviewed  POCT URINALYSIS DIP (MANUAL ENTRY) - Abnormal; Notable for the following:       Result Value   Clarity, UA cloudy (*)    Ketones, POC UA small (15) (*)    Blood, UA moderate (*)    Protein Ur, POC >=300 (*)    Nitrite, UA Positive (*)    Leukocytes, UA small (1+) (*)    All other components within normal limits  URINE CULTURE  COMPLETE METABOLIC PANEL WITH GFR  POCT CBC W AUTO DIFF ([facility])    Imaging Review Ct Abdomen Pelvis W Contrast  Result Date: 07/29/2016 CLINICAL DATA:  Lower abdominal pain with bloody stool for 2 days. EXAM: CT ABDOMEN AND PELVIS WITH CONTRAST TECHNIQUE: Multidetector CT imaging of the abdomen and pelvis was performed using the standard protocol following bolus administration of intravenous contrast. CONTRAST:  100 cc Isovue-300 COMPARISON:  None. FINDINGS: Lower chest:  Unremarkable. Hepatobiliary: No focal abnormality within the liver parenchyma. There is no evidence for gallstones, gallbladder wall thickening, or pericholecystic fluid. No intrahepatic or extrahepatic biliary dilation. Pancreas: No focal mass lesion. No dilatation of the main duct. No intraparenchymal cyst. No peripancreatic edema. Spleen: 9 mm low-density lesion anterior spleen is likely a cyst or pseudocyst. Adrenals/Urinary Tract: No adrenal nodule or mass. Right kidney is unremarkable. 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 Subtle edema or inflammation is seen around the left renal pelvis and left ureter. There may be some minimal edema around the right ureter. The urinary bladder appears normal for the degree of distention.  Stomach/Bowel: Stomach is nondistended. No gastric wall thickening. No evidence of outlet obstruction. Duodenum is normally positioned as is the ligament of Treitz. No small bowel wall thickening. No small bowel dilatation. The terminal ileum is normal. The appendix is normal. No gross colonic mass. No colonic wall thickening. No substantial diverticular change. Vascular/Lymphatic: No abdominal aortic aneurysm. No abdominal aortic atherosclerotic calcification. There is no gastrohepatic or hepatoduodenal ligament lymphadenopathy. No intraperitoneal or retroperitoneal lymphadenopathy. No pelvic sidewall lymphadenopathy. Reproductive: Uterus is unremarkable.  There is no adnexal mass. Other: No intraperitoneal free fluid. Musculoskeletal: Bone windows reveal no worrisome lytic or sclerotic osseous lesions. 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 IMPRESSION: 1. Left greater than right renal collecting system fullness with edema/inflammation seen around the left renal pelvis and diffusely along the left ureter. There is some minimal edema seen along the course of the right ureter is well. The bilateral appearance raises the question of urinary tract infection although no obvious CT signs of pyelonephritis are evident  today. Recent stone passage or urothelial neoplasm can present with similar imaging features, but this is typically unilateral. 2. Unremarkable appearance of the gastrointestinal tract in this patient with a history of bloody stool. 3. 9 mm hypodensity in the anterior spleen is nonspecific. Followup MRI in 3-6 months could be used to ensure stability. Electronically Signed   By: Misty Stanley M.D.   On: 07/29/2016 16:06      MDM   1. Dysuria   2. Bloody stool   3. Lower abdominal pain   4. UTI (lower urinary tract infection)   5. Mild anemia    Pt c/o lower abdominal pain with dysuria and urinary frequency but is concerned with blood she noticed in her stool this morning. Family  hx of colorectal cancer. Pt had normal colonoscopy 5 years ago, do for anther one. Pt appears well. NAD. Temp 99*F in triage. Moist mucous membranes. Tenderness to lower abdomen and suprapubic region. UA: c/w UTI, will send culture  CT abd due to pt's concern, family hx, and tenderness on exam.  CT c/w UTI, no source for bloody stool noted on CT. Discussed CT results with pt. Encouraged f/u for her colonoscopy, especially if bloody stool persists.  Encouraged to discuss f/u MRI in 3-6 months for lesion noted on spleen with her PCP.  Rx: Bactrim (pt has unknown allergic reaction to PCN) Encouraged good hydration    Noland Fordyce, PA-C 07/29/16 1634

## 2016-07-31 LAB — URINE CULTURE: Colony Count: >=100000

## 2016-08-01 ENCOUNTER — Telehealth: Payer: Self-pay | Admitting: Emergency Medicine

## 2016-08-01 NOTE — Telephone Encounter (Signed)
Urine culture did grow e-coli, finish all meds, use good hygiene. She is getting better

## 2017-02-25 ENCOUNTER — Telehealth: Payer: Self-pay | Admitting: Obstetrics and Gynecology

## 2017-02-25 NOTE — Telephone Encounter (Signed)
That is okay, please schedule a visit at the patient's convenience

## 2017-02-25 NOTE — Telephone Encounter (Signed)
Unable to LVM advising patient of message below.

## 2017-02-25 NOTE — Telephone Encounter (Signed)
Patient would like to re establish with Dr. Larose Kells please advise

## 2017-02-26 NOTE — Telephone Encounter (Signed)
Patient scheduled for 02/28/2017 at 8am

## 2017-02-28 ENCOUNTER — Encounter: Payer: Self-pay | Admitting: Internal Medicine

## 2017-02-28 ENCOUNTER — Ambulatory Visit (HOSPITAL_BASED_OUTPATIENT_CLINIC_OR_DEPARTMENT_OTHER)
Admission: RE | Admit: 2017-02-28 | Discharge: 2017-02-28 | Disposition: A | Discharge status: Home / Self Care | Payer: 59 | Source: Ambulatory Visit | Attending: Internal Medicine | Admitting: Internal Medicine

## 2017-02-28 ENCOUNTER — Ambulatory Visit (INDEPENDENT_AMBULATORY_CARE_PROVIDER_SITE_OTHER): Payer: 59 | Admitting: Internal Medicine

## 2017-02-28 VITALS — BP 132/80 | HR 90 | Temp 98.1°F | Resp 14 | Ht 65.0 in | Wt 193.0 lb

## 2017-02-28 DIAGNOSIS — R946 Abnormal results of thyroid function studies: Secondary | ICD-10-CM

## 2017-02-28 DIAGNOSIS — E010 Iodine-deficiency related diffuse (endemic) goiter: Secondary | ICD-10-CM | POA: Diagnosis not present

## 2017-02-28 DIAGNOSIS — R7989 Other specified abnormal findings of blood chemistry: Secondary | ICD-10-CM

## 2017-02-28 DIAGNOSIS — D649 Anemia, unspecified: Secondary | ICD-10-CM

## 2017-02-28 DIAGNOSIS — Z1211 Encounter for screening for malignant neoplasm of colon: Secondary | ICD-10-CM

## 2017-02-28 DIAGNOSIS — Z0000 Encounter for general adult medical examination without abnormal findings: Secondary | ICD-10-CM | POA: Diagnosis not present

## 2017-02-28 LAB — LIPID PANEL
CHOL/HDL RATIO: 3
Cholesterol: 202 mg/dL — ABNORMAL HIGH (ref 0–200)
HDL: 57.7 mg/dL (ref >39.00)
LDL Cholesterol: 123 mg/dL — ABNORMAL HIGH (ref 0–99)
NonHDL: 144.12
Triglycerides: 104 mg/dL (ref 0.0–149.0)
VLDL: 20.8 mg/dL (ref 0.0–40.0)

## 2017-02-28 LAB — CBC WITH DIFFERENTIAL/PLATELET
Basophils Absolute: 0 10*3/uL (ref 0.0–0.1)
Basophils Relative: 0.9 % (ref 0.0–3.0)
Eosinophils Absolute: 0.2 10*3/uL (ref 0.0–0.7)
Eosinophils Relative: 3.1 % (ref 0.0–5.0)
HCT: 34.1 % — ABNORMAL LOW (ref 36.0–46.0)
HEMOGLOBIN: 11.2 g/dL — AB (ref 12.0–15.0)
LYMPHS ABS: 1.4 10*3/uL (ref 0.7–4.0)
Lymphocytes Relative: 26.6 % (ref 12.0–46.0)
MCHC: 32.9 g/dL (ref 30.0–36.0)
MCV: 82.6 fl (ref 78.0–100.0)
MONOS PCT: 7.7 % (ref 3.0–12.0)
Monocytes Absolute: 0.4 10*3/uL (ref 0.1–1.0)
NEUTROS PCT: 61.7 % (ref 43.0–77.0)
Neutro Abs: 3.3 10*3/uL (ref 1.4–7.7)
Platelets: 354 10*3/uL (ref 150.0–400.0)
RBC: 4.13 Mil/uL (ref 3.87–5.11)
RDW: 16 % — ABNORMAL HIGH (ref 11.5–15.5)
WBC: 5.3 10*3/uL (ref 4.0–10.5)

## 2017-02-28 LAB — TSH: TSH: 4.24 u[IU]/mL (ref 0.35–4.50)

## 2017-02-28 LAB — HEMOGLOBIN A1C: HEMOGLOBIN A1C: 5.7 % (ref 4.6–6.5)

## 2017-02-28 LAB — T4, FREE: Free T4: 0.57 ng/dL — ABNORMAL LOW (ref 0.60–1.60)

## 2017-02-28 LAB — T3, FREE: T3 FREE: 3.1 pg/mL (ref 2.3–4.2)

## 2017-02-28 NOTE — Progress Notes (Signed)
   Subjective:    Patient ID: Betty Hines, female    DOB: [birth year], 46 y.o.   MRN: [id number]  DOS:  02/28/2017 Type of visit - description : new pt Interval history: Here for a physical, feeling well, no concerns.   Review of Systems  A 14 point review of systems is negative    No past medical history on file.  Past Surgical History:  Procedure Laterality Date  . CESAREAN SECTION     x2    Social History   Social History  . Marital status: Married    Spouse name: N/A  . Number of children: 2  . Years of education: N/A   Occupational History  . HR , [employer]    Social History Main Topics  . Smoking status: Never Smoker  . Smokeless tobacco: Never Used  . Alcohol use No  . Drug use: No  . Sexual activity: Not on file   Other Topics Concern  . Not on file   Social History Narrative   1999, daughter, college   2002, son   Family History  Problem Relation Age of Onset  . Adopted: Yes  . Colon cancer      2 aunts and 1 uncle , youngest index case??  . Breast cancer Neg Hx   . Coronary artery disease Neg Hx   . Diabetes Neg Hx      Allergies as of 02/28/2017      Reactions   Penicillins    Unknown, was advised as a child she was allergic      Medication List    as of 02/28/2017 11:59 PM   You have not been prescribed any medications.        Objective:   Physical Exam BP 132/80 (BP Location: Left Arm, Patient Position: Sitting, Cuff Size: Normal)   Pulse 90   Temp 98.1 F (36.7 C) (Oral)   Resp 14   Ht 5\' 5"  (1.651 m)   Wt 193 lb (87.5 kg)   LMP 02/28/2017   SpO2 98%   BMI 32.12 kg/m   General:   Well developed, well nourished . NAD.  Neck: Thyroid gland seems to be enlarged, symmetric and not nodular or tender.  HEENT:  Normocephalic . Face symmetric, atraumatic Lungs:  CTA B Normal respiratory effort, no intercostal retractions, no accessory muscle use. Heart: RRR,  no murmur.  No pretibial edema bilaterally  Abdomen:    Not distended, soft, non-tender. No rebound or rigidity.   Skin: Exposed areas without rash. Not pale. Not jaundice Neurologic:  alert & oriented X3.  Speech normal, gait appropriate for age and unassisted Strength symmetric and appropriate for age.  Psych: Cognition and judgment appear intact.  Cooperative with normal attention span and concentration.  Behavior appropriate. No anxious or depressed appearing.    Assessment & Plan:   Assessment Healthy  Contraception- husband vasectomy  Plan: Here for a CPX, seems to be doing well. Thyromegaly? checking TFTs, will also rx a [place] RTC one year

## 2017-02-28 NOTE — Patient Instructions (Signed)
GO TO THE LAB : Get the blood work     GO TO THE FRONT DESK Schedule your next appointment for a  physical exam in one year  

## 2017-02-28 NOTE — Progress Notes (Signed)
Pre visit review using our clinic review tool, if applicable. No additional management support is needed unless otherwise documented below in the visit note. 

## 2017-02-28 NOTE — Assessment & Plan Note (Addendum)
--   Td  8- 2010; declined a flu shots before  --Gyn Dr Edwyna Ready, due for a MMG, patient will call --Cscope 2007, normal, refer   to GI, + FH Diet exercise discussed Labs: FLP, A1c, CBC, TFTs.

## 2017-03-02 DIAGNOSIS — Z09 Encounter for follow-up examination after completed treatment for conditions other than malignant neoplasm: Secondary | ICD-10-CM | POA: Insufficient documentation

## 2017-03-02 NOTE — Assessment & Plan Note (Signed)
Here for a CPX, seems to be doing well. Thyromegaly? checking TFTs, will also rx a [place] RTC one year

## 2017-03-03 NOTE — Addendum Note (Signed)
Addended byDamita Dunnings D on: 03/03/2017 11:34 AM   Modules accepted: Orders

## 2017-03-31 ENCOUNTER — Encounter: Payer: Self-pay | Admitting: Gastroenterology

## 2017-05-02 ENCOUNTER — Ambulatory Visit: Payer: 59 | Admitting: Gastroenterology

## 2017-09-28 IMAGING — CT CT ABD-PELV W/ CM
2 of 5 series · 16 of 46 positions shown, 18 images · IV contrast (APPLIED)
Comparison: None.

CLINICAL DATA: Lower abdominal pain with bloody stool for 2 days.

EXAM:
CT ABDOMEN AND PELVIS WITH CONTRAST
TECHNIQUE: Multidetector CT imaging of the abdomen and pelvis was performed
using the standard protocol following bolus administration of
intravenous contrast.
CONTRAST:  100 cc Tsovue-ACC

[Series 2: axial st · axial · 0.90mm/px · z∈[+644,+1014]mm · 13 of 84 slices shown, 15 images]
[im 5/84  soft-tissue]
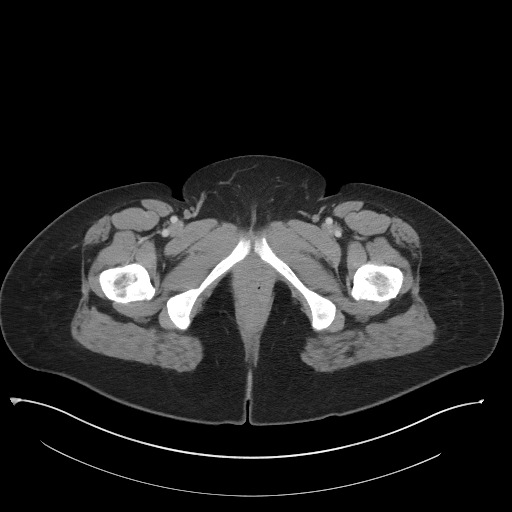
[im 5/84  bone]
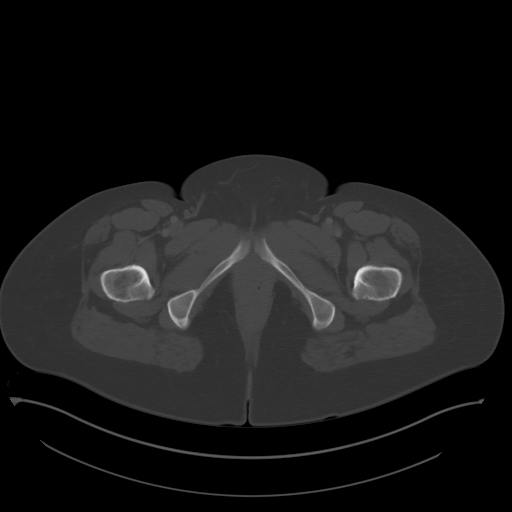
[im 14/84  soft-tissue]
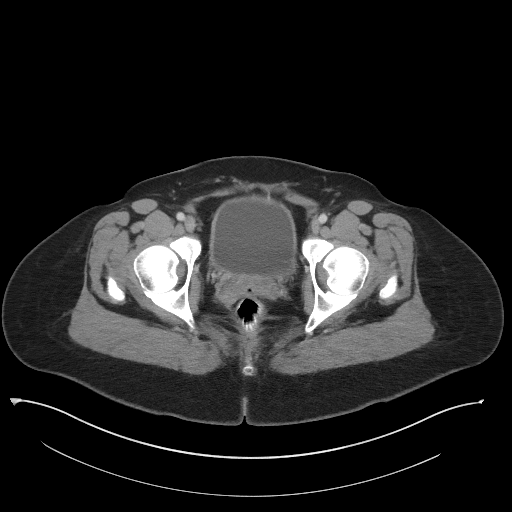
[im 18/84  soft-tissue]
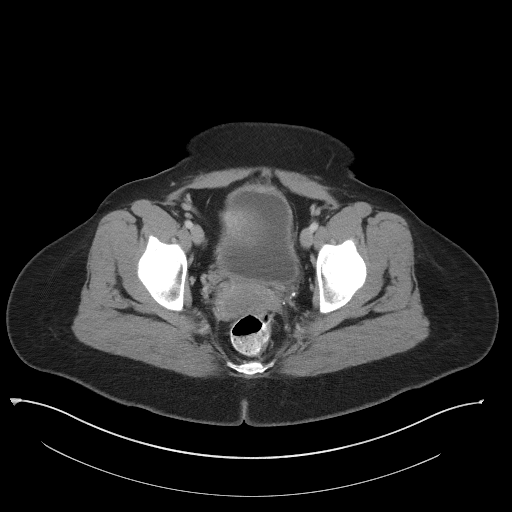
[im 22/84  soft-tissue]
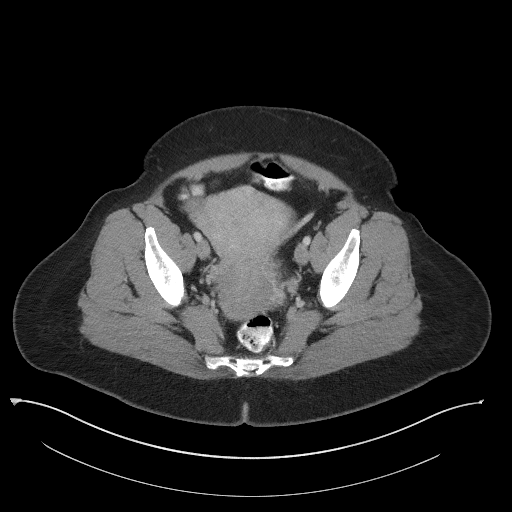
[im 31/84  soft-tissue]
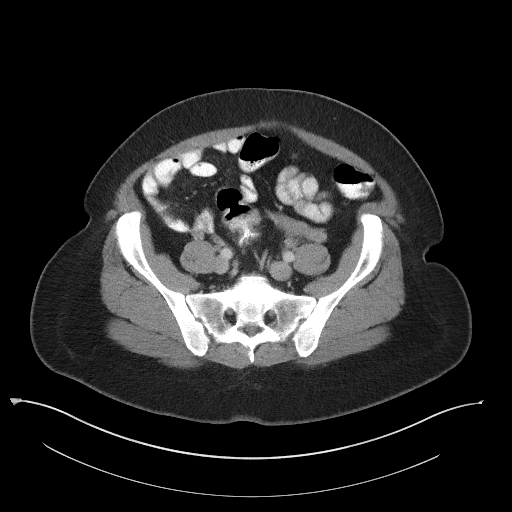
[im 35/84  soft-tissue]
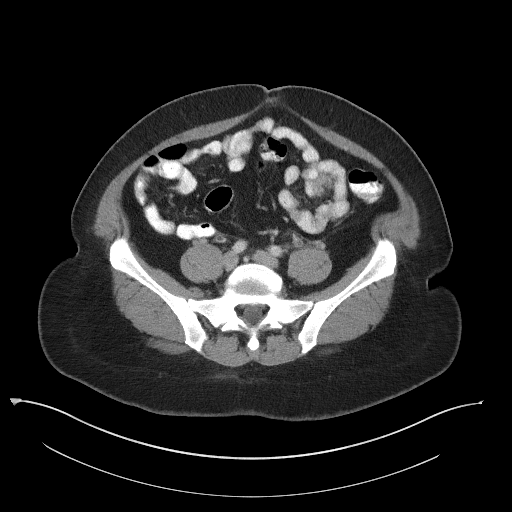
[im 44/84  soft-tissue]
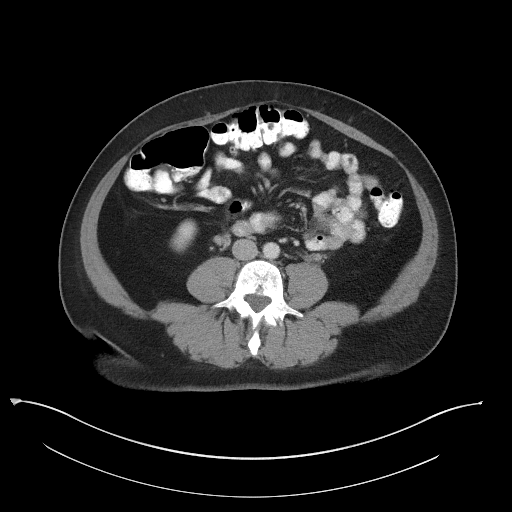
[im 49/84  soft-tissue]
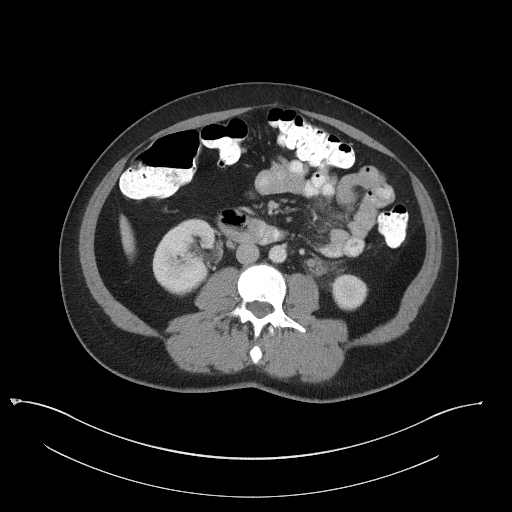
[im 53/84  soft-tissue]
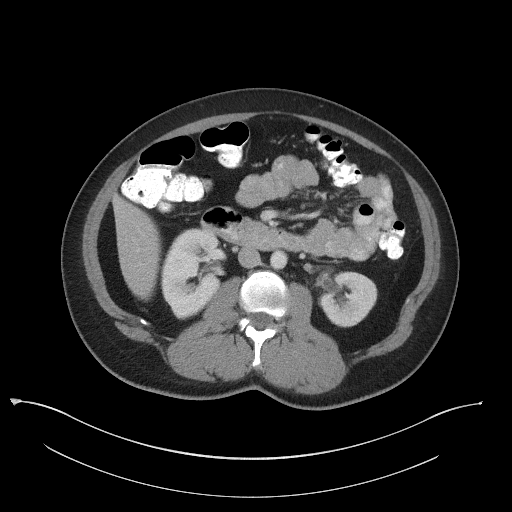
[im 53/84  bone]
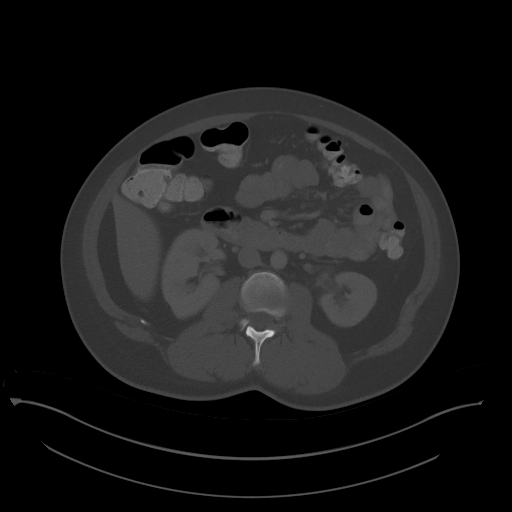
[im 62/84  soft-tissue]
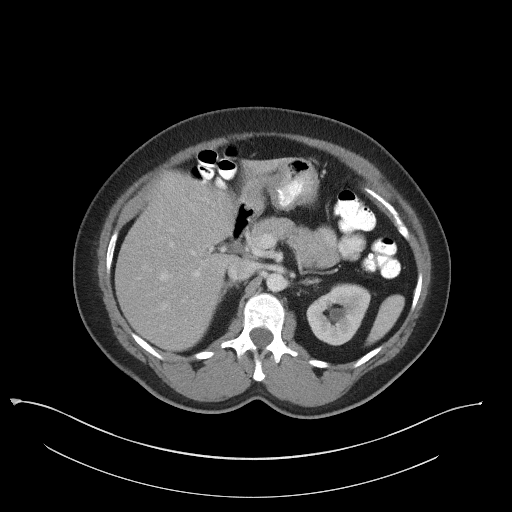
[im 66/84  soft-tissue]
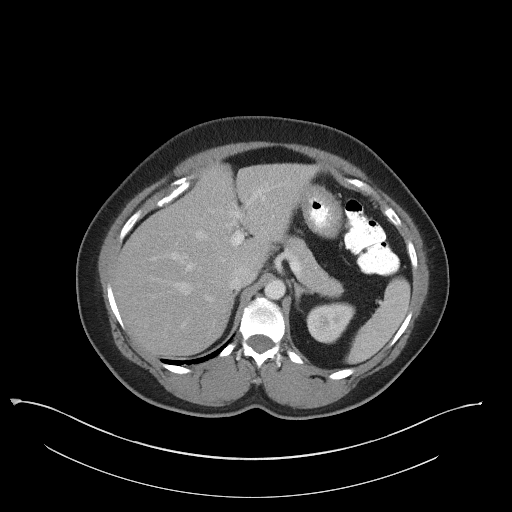
[im 70/84  soft-tissue]
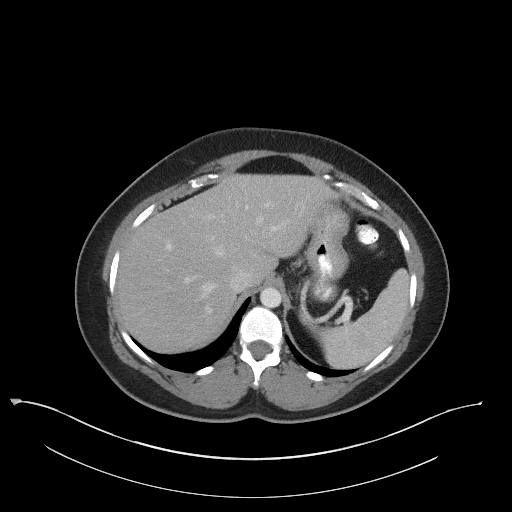
[im 79/84  soft-tissue]
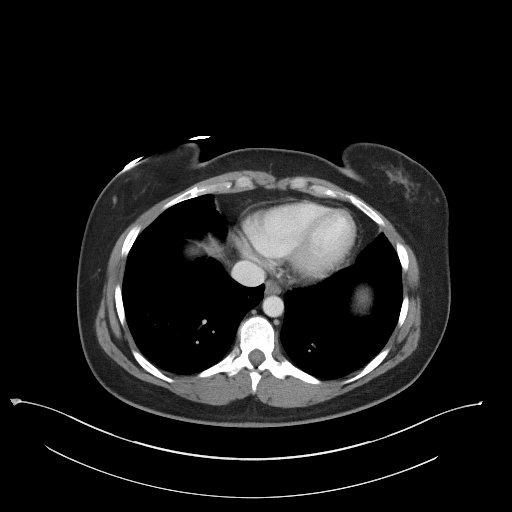

[Series 5: coronal st · coronal · 0.83mm/px · 3 of 100 slices shown]
[im 34/100  soft-tissue]
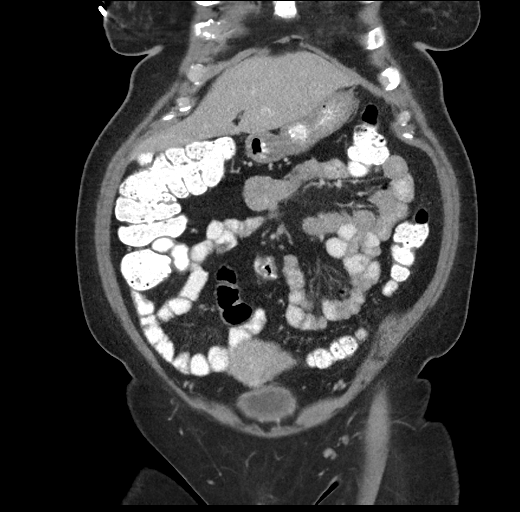
[im 45/100  soft-tissue]
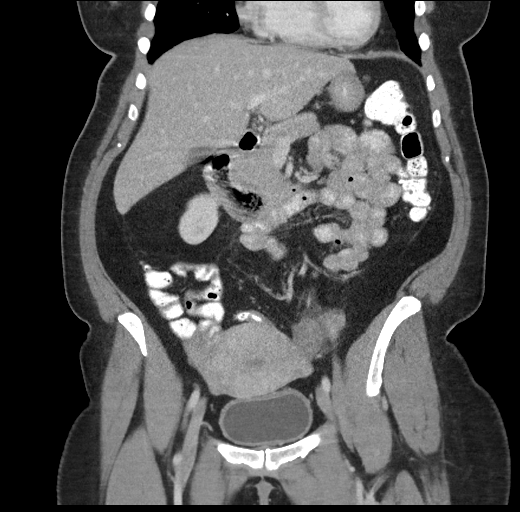
[im 56/100  soft-tissue]
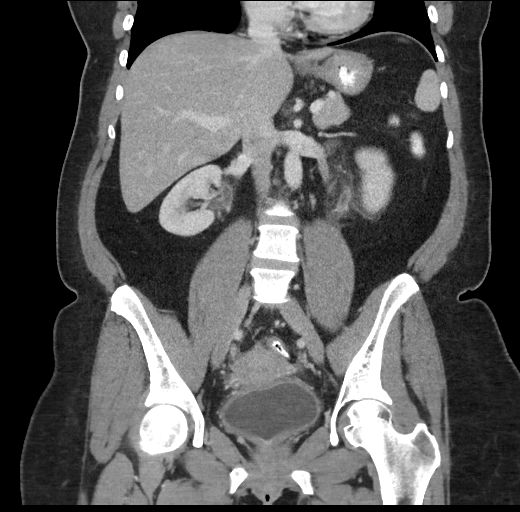

[16 of 46 positions shown; findings below may reference images not displayed]

FINDINGS: Lower chest:  Unremarkable.

Hepatobiliary: No focal abnormality within the liver parenchyma.
There is no evidence for gallstones, gallbladder wall thickening, or
pericholecystic fluid. No intrahepatic or extrahepatic biliary
dilation.

Pancreas: No focal mass lesion. No dilatation of the main duct. No
intraparenchymal cyst. No peripancreatic edema.

Spleen: 9 mm low-density lesion anterior spleen is likely a cyst or
pseudocyst.

Adrenals/Urinary Tract: No adrenal nodule or mass. Right kidney is
unremarkable. There is mild fullness of the left intrarenal
collecting system with duplication of the left intrarenal collecting
system. Subtle edema or inflammation is seen around the left renal
pelvis and left ureter. There may be some minimal edema around the
right ureter. The urinary bladder appears normal for the degree of
distention.

Stomach/Bowel: Stomach is nondistended. No gastric wall thickening.
No evidence of outlet obstruction. Duodenum is normally positioned
as is the ligament of Treitz. No small bowel wall thickening. No
small bowel dilatation. The terminal ileum is normal. The appendix
is normal. No gross colonic mass. No colonic wall thickening. No
substantial diverticular change.

Vascular/Lymphatic: No abdominal aortic aneurysm. No abdominal
aortic atherosclerotic calcification. There is no gastrohepatic or
hepatoduodenal ligament lymphadenopathy. No intraperitoneal or
retroperitoneal lymphadenopathy. No pelvic sidewall lymphadenopathy.

Reproductive: Uterus is unremarkable.  There is no adnexal mass.

Other: No intraperitoneal free fluid.

Musculoskeletal: Bone windows reveal no worrisome lytic or sclerotic
osseous lesions. Bilateral pars interarticularis defects are seen at
L5.
IMPRESSION: 1. Left greater than right renal collecting system fullness with
edema/inflammation seen around the left renal pelvis and diffusely
along the left ureter. There is some minimal edema seen along the
course of the right ureter is well. The bilateral appearance raises
the question of urinary tract infection although no obvious CT signs
of pyelonephritis are evident today. Recent stone passage or
urothelial neoplasm can present with similar imaging features, but
this is typically unilateral.
2. Unremarkable appearance of the gastrointestinal tract in this
patient with a history of bloody stool.
3. 9 mm hypodensity in the anterior spleen is nonspecific. Followup
MRI in 3-6 months could be used to ensure stability.

## 2018-03-30 ENCOUNTER — Telehealth: Payer: Self-pay

## 2018-03-30 DIAGNOSIS — E041 Nontoxic single thyroid nodule: Secondary | ICD-10-CM

## 2018-03-30 NOTE — Telephone Encounter (Signed)
-----   Message from Colon Branch, MD sent at 03/30/2018 11:18 AM EDT ----- Regarding: Ultrasound Enter order for ultrasound of the thyroid, follow-up nodule Then sent the patient a message that she needs a ultrasound,  she should be contacted soon.

## 2018-03-30 NOTE — Telephone Encounter (Signed)
Order placed.    MyChart message sent.

## 2018-05-21 IMAGING — US US THYROID
1 series · 13 of 25 positions shown · non-contrast
Comparison: None.

CLINICAL DATA: Goiter.  Thyromegaly

EXAM:
THYROID ULTRASOUND
TECHNIQUE: Ultrasound examination of the thyroid gland and adjacent soft
tissues was performed.

[Series 1: us thyroid · 0.06mm/px · 13 of 36 slices shown]
[im 1/36]
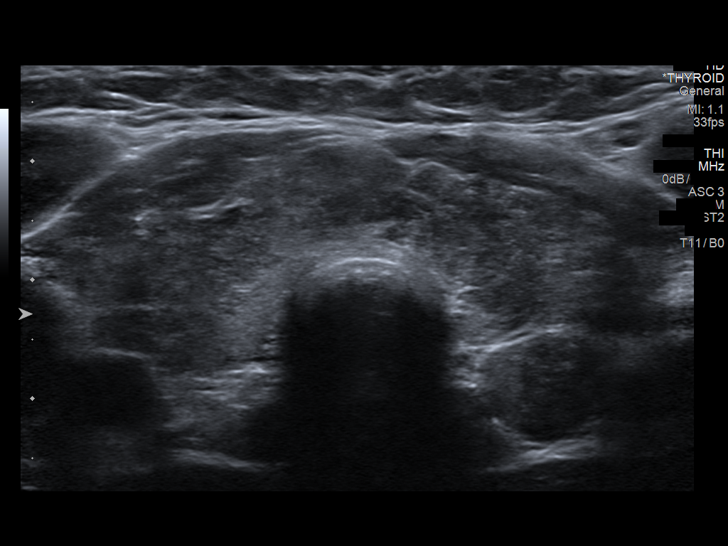
[im 3/36]
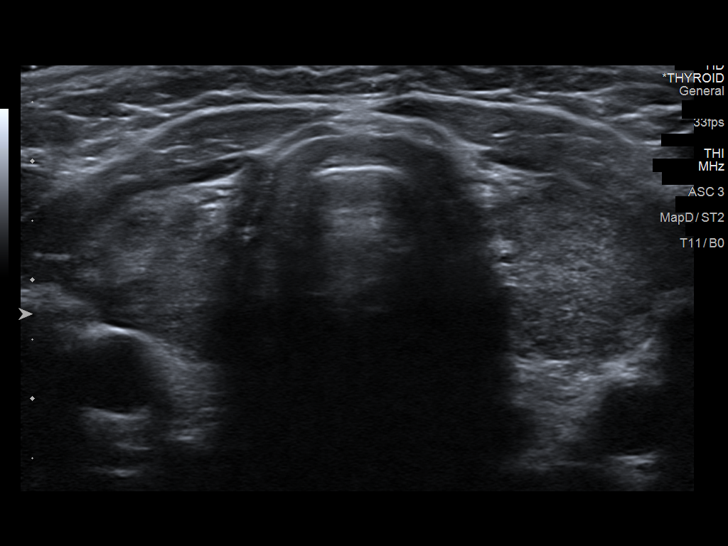
[im 6/36]
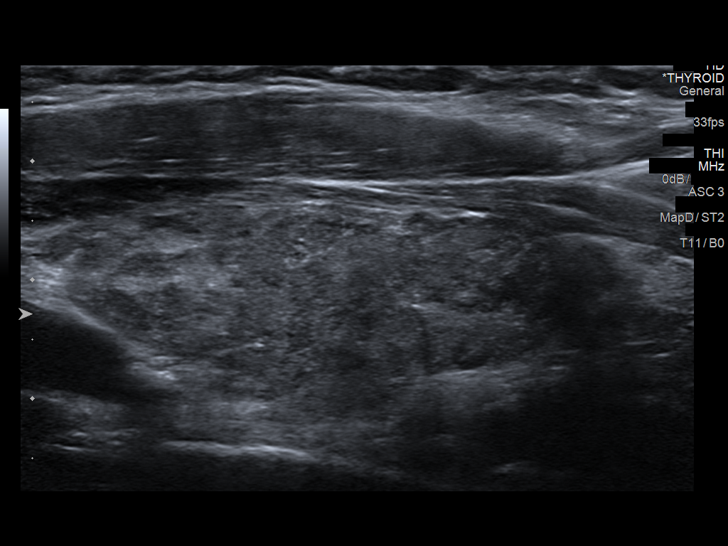
[im 9/36]
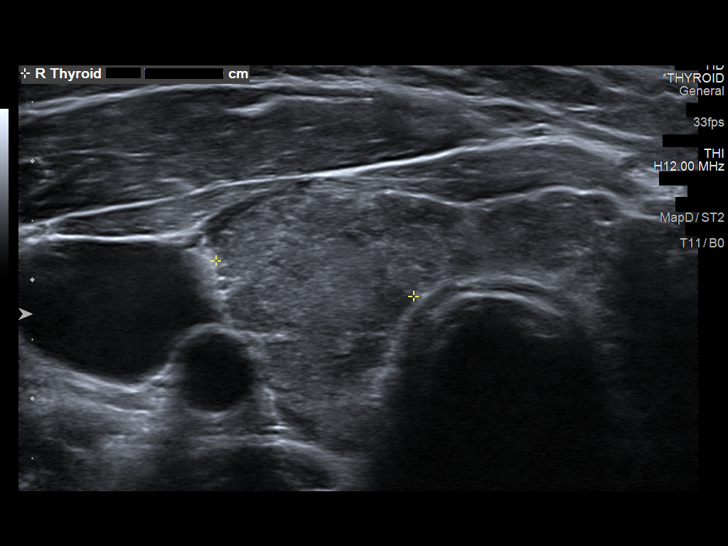
[im 12/36]
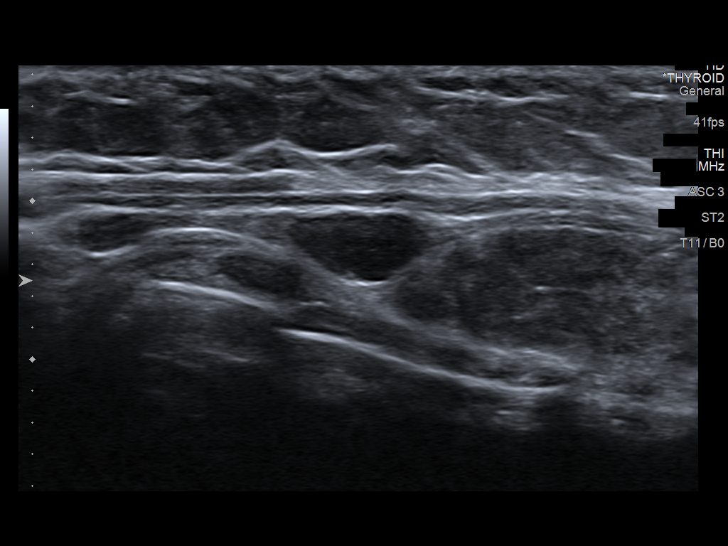
[im 15/36]
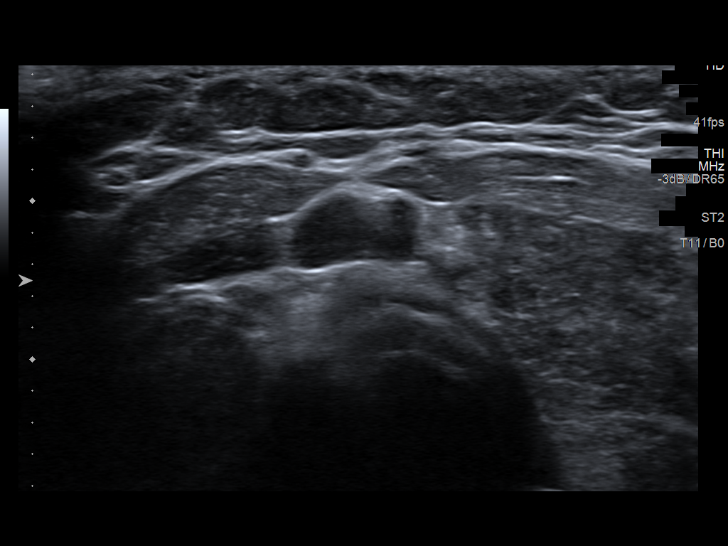
[im 18/36]
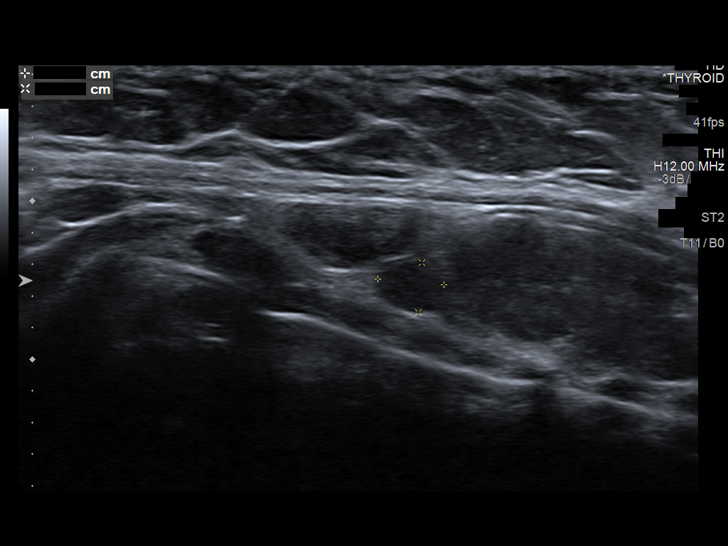
[im 21/36]
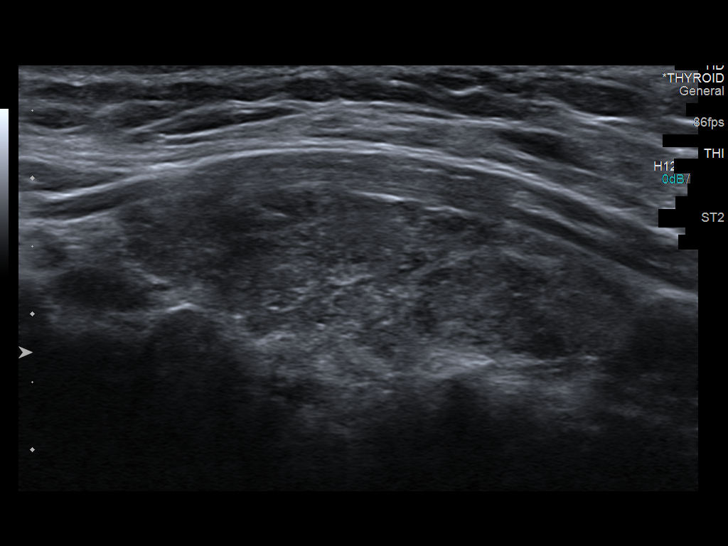
[im 24/36]
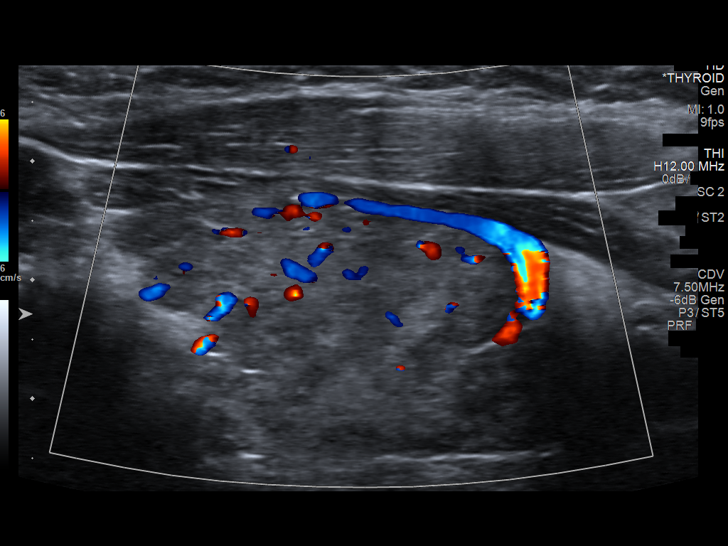
[im 27/36]
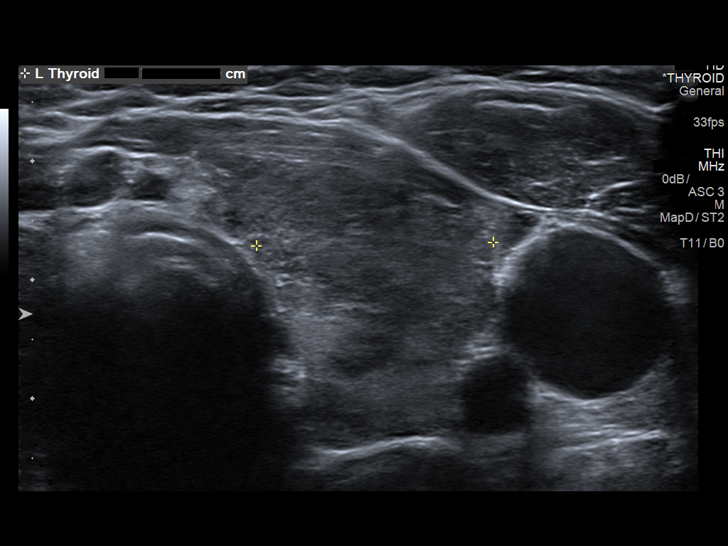
[im 30/36]
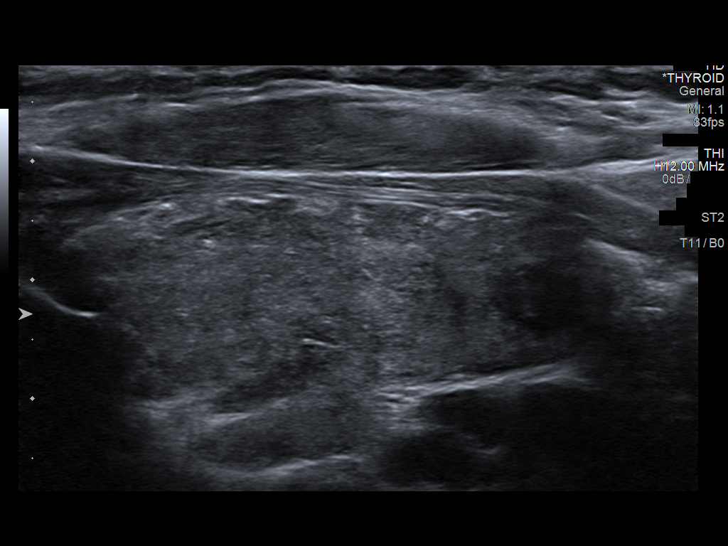
[im 33/36]
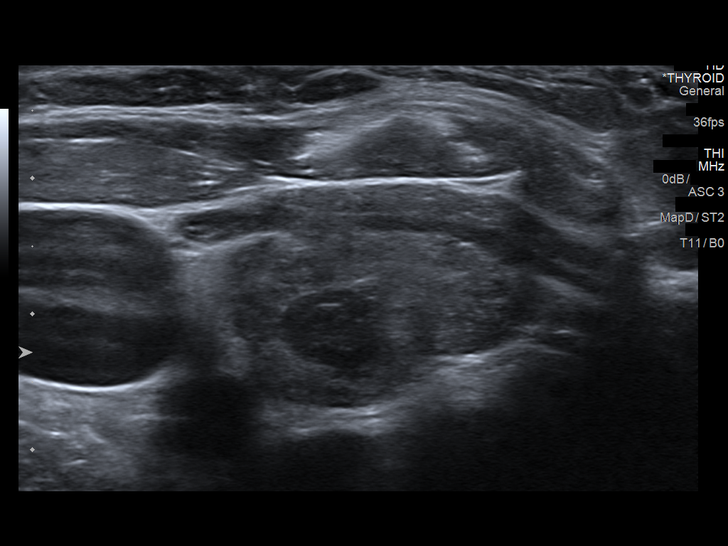
[im 36/36]
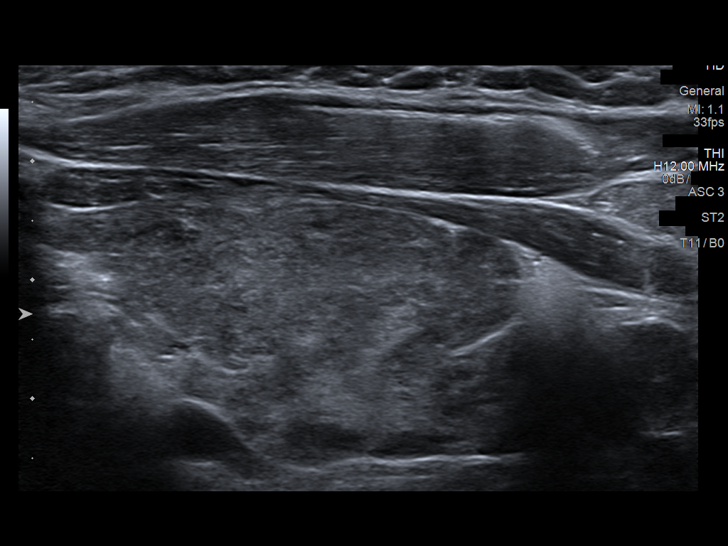

[13 of 25 positions shown; findings below may reference images not displayed]

FINDINGS: Parenchymal Echotexture: Moderately heterogenous

Isthmus: 0.8 cm

Right lobe: 5.1 x 2.2 x 1.7 cm

Left lobe: 4.4 x 2.0 x 2.0 cm

_________________________________________________________

Estimated total number of nodules >/= 1 cm: 0

Number of spongiform nodules >/=  2 cm not described below (TR1): 0

Number of mixed cystic and solid nodules >/= 1.5 cm not described
below (TR2): 0

_________________________________________________________

Nodule # 1:

Location: Isthmus; Mid

Maximum size: 1.0 cm; Other 2 dimensions: 0.9 x 0.4 cm

Composition: solid/almost completely solid (2)

Echogenicity: hypoechoic (2)

Shape: not taller-than-wide (0)

Margins: lobulated/irregular (2)

Echogenic foci: none (0)

ACR TI-RADS total points: 6.

ACR TI-RADS risk category: TR4 (4-6 points).

ACR TI-RADS recommendations:

*Given size (>/= 1 - 1.4 cm) and appearance, a follow-up ultrasound
in 1 year should be considered based on TI-RADS criteria.

_________________________________________________________

0.4 cm nodule in the isthmus has a benign appearance.

0.9 cm hypoechoic nodule in the right lower pole has a benign
appearance.
IMPRESSION: 1.0 cm nodule in the isthmus meets criteria for annual follow-up.
Other nodules have a benign appearance.

The above is in keeping with the ACR TI-RADS recommendations - [HOSPITAL] 6235;[DATE].

## 2019-07-28 ENCOUNTER — Encounter: Payer: Self-pay | Admitting: Internal Medicine

## 2019-11-13 ENCOUNTER — Other Ambulatory Visit: Payer: Self-pay

## 2019-11-13 ENCOUNTER — Ambulatory Visit
Admission: EM | Admit: 2019-11-13 | Discharge: 2019-11-13 | Disposition: A | Discharge status: Home / Self Care | Payer: BC Managed Care – PPO

## 2019-11-13 ENCOUNTER — Encounter: Payer: Self-pay | Admitting: Emergency Medicine

## 2019-11-13 DIAGNOSIS — Z23 Encounter for immunization: Secondary | ICD-10-CM

## 2019-11-13 DIAGNOSIS — S61210A Laceration without foreign body of right index finger without damage to nail, initial encounter: Secondary | ICD-10-CM | POA: Diagnosis not present

## 2019-11-13 MED ORDER — TETANUS-DIPHTH-ACELL PERTUSSIS 5-2.5-18.5 LF-MCG/0.5 IM SUSP
0.5000 mL | Freq: Once | INTRAMUSCULAR | Status: AC
  Administered 2019-11-13: 13:00:00 0.5 mL via INTRAMUSCULAR

## 2019-11-13 NOTE — Discharge Instructions (Addendum)
Keep area clean and dry. Use ice, Tylenol, ibuprofen as needed for pain, swelling. Return for worsening pain, swelling, pus, fever.

## 2019-11-13 NOTE — ED Notes (Signed)
Patient able to ambulate independently  

## 2019-11-13 NOTE — ED Provider Notes (Signed)
[name]    CSN: [id number] Arrival date & time: 11/13/19  1146      History   Chief Complaint Chief Complaint  Patient presents with  . Laceration    HPI Betty Hines is a 48 y.o. female presenting for right first finger laceration over MCP.  Patient states she was washing dishes, cut her finger on glass.  Patient was able to achieve hemostasis PTA.  Denies easy bruising/bleeding, anticoagulant/blood thinner use.  Patient unsure of last tetanus shot.   History reviewed. No pertinent past medical history.  Patient Active Problem List   Diagnosis Date Noted  . PCP NOTES >>>>>>>>>>>>>>>>>> 03/02/2017  . Annual physical exam 08/27/2011  . COLONOSCOPY, HX OF 08/10/2007    Past Surgical History:  Procedure Laterality Date  . CESAREAN SECTION     x2    OB History   No obstetric history on file.      Home Medications    Prior to Admission medications   Medication Sig Start Date End Date Taking? Authorizing Provider  Nyoka Cowden Tea 150 MG CAPS Take by mouth.   Yes [provider]  Multiple Vitamin (MULTIVITAMIN) LIQD Take 5 mLs by mouth daily.   Yes [provider]  Omega-3 Fatty Acids (FISH OIL PO) Take by mouth.   Yes [provider]    Family History Family History  Adopted: Yes  Problem Relation Age of Onset  . Colon cancer Other        2 aunts and 1 uncle , youngest index case??  . Breast cancer Neg Hx   . Coronary artery disease Neg Hx   . Diabetes Neg Hx     Social History Social History   Tobacco Use  . Smoking status: Never Smoker  . Smokeless tobacco: Never Used  Substance Use Topics  . Alcohol use: No  . Drug use: No     Allergies   Penicillins   Review of Systems Review of Systems  Constitutional: Negative for fatigue and fever.  HENT: Negative for ear pain, sinus pain, sore throat and voice change.   Eyes: Negative for pain, redness and visual disturbance.  Respiratory: Negative for  cough and shortness of breath.   Cardiovascular: Negative for chest pain and palpitations.  Gastrointestinal: Negative for abdominal pain, diarrhea and vomiting.  Musculoskeletal: Negative for arthralgias and myalgias.  Skin: Positive for wound. Negative for rash.  Neurological: Negative for syncope and headaches.     Physical Exam Triage Vital Signs ED Triage Vitals [11/13/19 1158]  Enc Vitals Group     BP (!) 146/89     Pulse Rate 84     Resp 16     Temp 97.9 F (36.6 C)     Temp Source Temporal     SpO2 98 %     Weight      Height      Head Circumference      Peak Flow      Pain Score 0     Pain Loc      Pain Edu?      Excl. in [place]?    No data found.  Updated Vital Signs BP (!) 146/89 (BP Location: Left Arm)   Pulse 84   Temp 97.9 F (36.6 C) (Temporal)   Resp 16   SpO2 98%   Visual Acuity Right Eye Distance:   Left Eye Distance:   Bilateral Distance:    Right Eye Near:   Left Eye Near:  Bilateral Near:     Physical Exam Constitutional:      General: She is not in acute distress. HENT:     Head: Normocephalic and atraumatic.  Eyes:     General: No scleral icterus.    Pupils: Pupils are equal, round, and reactive to light.  Cardiovascular:     Rate and Rhythm: Normal rate.  Pulmonary:     Effort: Pulmonary effort is normal.  Musculoskeletal: Normal range of motion.  Skin:    Capillary Refill: Capillary refill takes less than 2 seconds.     Comments: 1 cm superficial laceration over lateral aspect of second MCP, right hand.  No foreign body identified.  Neurological:     Mental Status: She is alert and oriented to person, place, and time.     Sensory: No sensory deficit.     Motor: No weakness.      UC Treatments / Results  Labs (all labs ordered are listed, but only abnormal results are displayed) Labs Reviewed - No data to display  EKG   Radiology No results found.  Procedures Laceration Repair  Date/Time: 11/13/2019 1:36 PM  Performed by: Quincy Sheehan, PA-C Authorized by: Quincy Sheehan, PA-C   Consent:    Consent obtained:  Verbal   Consent given by:  Patient   Risks discussed:  Infection, need for additional repair, pain, poor cosmetic result and poor wound healing   Alternatives discussed:  No treatment and delayed treatment Universal protocol:    Patient identity confirmed:  Verbally with patient Anesthesia (see MAR for exact dosages):    Anesthesia method:  None Laceration details:    Location:  Finger   Finger location:  R index finger   Length (cm):  1   Depth (mm):  2 Repair type:    Repair type:  Simple Pre-procedure details:    Preparation:  Patient was prepped and draped in usual sterile fashion Exploration:    Hemostasis achieved with:  Direct pressure   Wound exploration: wound explored through full range of motion     Contaminated: no   Treatment:    Amount of cleaning:  Standard   Irrigation solution:  Tap water   Irrigation volume:  1 minute   Irrigation method:  Tap Skin repair:    Repair method:  Steri-Strips   Number of Steri-Strips:  3 Approximation:    Approximation:  Close Post-procedure details:    Dressing:  Non-adherent dressing   Patient tolerance of procedure:  Tolerated well, no immediate complications   (including critical care time)  Medications Ordered in UC Medications  Tdap (BOOSTRIX) injection 0.5 mL (0.5 mLs Intramuscular Given 11/13/19 1234)    Initial Impression / Assessment and Plan / UC Course  I have reviewed the triage vital signs and the nursing notes.  Pertinent labs & imaging results that were available during my care of the patient were reviewed by me and considered in my medical decision making (see chart for details).     3 Steri-Strips placed in office which patient tolerated well.  Patient given updated Tdap booster.  Reviewed appropriate wound care.  Patient denies diabetes, immunocompromise status: Low concern for  infection at this time-antibiotics deferred.  Return precautions discussed, patient verbalized understanding and is agreeable to plan. Final Clinical Impressions(s) / UC Diagnoses   Final diagnoses:  Laceration of right index finger without foreign body without damage to nail, initial encounter     Discharge Instructions     Keep area clean and dry.  Use ice, Tylenol, ibuprofen as needed for pain, swelling. Return for worsening pain, swelling, pus, fever.    ED Prescriptions    None     PDMP not reviewed this encounter.   Hall[place]Potvin, [place], [place] 11/13/19 1337

## 2019-11-13 NOTE — ED Triage Notes (Signed)
Pt presents to [facility] for assessment after cutting her right first knuckle on a glass that broke while she was doing dishes.  Believes she is due for a tetanus shot

## 2020-03-23 LAB — HM PAP SMEAR

## 2020-03-23 LAB — HM MAMMOGRAPHY

## 2020-03-28 ENCOUNTER — Encounter: Payer: Self-pay | Admitting: Internal Medicine

## 2020-03-28 LAB — HM PAP SMEAR: HM Pap smear: NEGATIVE

## 2020-03-31 ENCOUNTER — Encounter: Payer: Self-pay | Admitting: Internal Medicine

## 2020-08-07 ENCOUNTER — Ambulatory Visit
Admission: EM | Admit: 2020-08-07 | Discharge: 2020-08-07 | Disposition: A | Discharge status: Home / Self Care | Payer: BC Managed Care – PPO | Attending: Physician Assistant | Admitting: Physician Assistant

## 2020-08-07 DIAGNOSIS — Z1152 Encounter for screening for COVID-19: Secondary | ICD-10-CM | POA: Diagnosis present

## 2020-08-07 DIAGNOSIS — J029 Acute pharyngitis, unspecified: Secondary | ICD-10-CM | POA: Insufficient documentation

## 2020-08-07 DIAGNOSIS — R519 Headache, unspecified: Secondary | ICD-10-CM | POA: Insufficient documentation

## 2020-08-07 LAB — POCT RAPID STREP A (OFFICE): Rapid Strep A Screen: NEGATIVE

## 2020-08-07 MED ORDER — LIDOCAINE VISCOUS HCL 2 % MT SOLN: OROMUCOSAL | 0 refills | Status: DC

## 2020-08-07 NOTE — ED Provider Notes (Signed)
[name]    CSN: [id number] Arrival date & time: 08/07/20  0802      History   Chief Complaint Chief Complaint  Patient presents with  . Sore Throat    HPI Betty Hines is a 49 y.o. female.   49 year old female comes in for 3 day of URI symptoms. Has had sore throat, headache, fatigue. Minimal cough. Denies rhinorrhea, nasal congestion. Denies fever, chills, body aches. Denies abdominal pain, nausea, vomiting, diarrhea. Denies shortness of breath, loss of taste/smell. COVID vaccinated[place]      History reviewed. No pertinent past medical history.  Patient Active Problem List   Diagnosis Date Noted  . PCP NOTES >>>>>>>>>>>>>>>>>> 03/02/2017  . Annual physical exam 08/27/2011  . COLONOSCOPY, HX OF 08/10/2007    Past Surgical History:  Procedure Laterality Date  . CESAREAN SECTION     x2    OB History   No obstetric history on file.      Home Medications    Prior to Admission medications   Medication Sig Start Date End Date Taking? Authorizing Provider  lidocaine (XYLOCAINE) 2 % solution 5-15 mL gurgle as needed 08/07/20   Tasia Catchings, Noel Henandez V, PA-C  Multiple Vitamin (MULTIVITAMIN) LIQD Take 5 mLs by mouth daily.    [provider]  Omega-3 Fatty Acids (FISH OIL PO) Take by mouth.    [provider]    Family History Family History  Adopted: Yes  Problem Relation Age of Onset  . Colon cancer Other        2 aunts and 1 uncle , youngest index case??  . Breast cancer Neg Hx   . Coronary artery disease Neg Hx   . Diabetes Neg Hx     Social History Social History   Tobacco Use  . Smoking status: Never Smoker  . Smokeless tobacco: Never Used  Substance Use Topics  . Alcohol use: No  . Drug use: No     Allergies   Penicillins   Review of Systems Review of Systems  Reason unable to perform ROS: See HPI as above.     Physical Exam Triage Vital Signs ED Triage Vitals [08/07/20  0813]  Enc Vitals Group     BP (!) 143/86     Pulse Rate 84     Resp 18     Temp 98.2 F (36.8 C)     Temp Source Oral     SpO2 95 %     Weight      Height      Head Circumference      Peak Flow      Pain Score 0     Pain Loc      Pain Edu?      Excl. in [place]?    No data found.  Updated Vital Signs BP (!) 143/86 (BP Location: Left Arm)   Pulse 84   Temp 98.2 F (36.8 C) (Oral)   Resp 18   LMP 06/24/2020   SpO2 95%   Physical Exam Constitutional:      General: She is not in acute distress.    Appearance: Normal appearance. She is not ill-appearing, toxic-appearing or diaphoretic.  HENT:     Head: Normocephalic and atraumatic.     Mouth/Throat:     Mouth: Mucous membranes are moist.     Pharynx: Oropharynx is clear. Uvula midline. Posterior oropharyngeal erythema present.  Cardiovascular:     Rate and  Rhythm: Normal rate and regular rhythm.     Heart sounds: Normal heart sounds. No murmur heard.  No friction rub. No gallop.   Pulmonary:     Effort: Pulmonary effort is normal. No accessory muscle usage, prolonged expiration, respiratory distress or retractions.     Comments: Lungs clear to auscultation without adventitious lung sounds. Musculoskeletal:     Cervical back: Normal range of motion and neck supple.  Neurological:     General: No focal deficit present.     Mental Status: She is alert and oriented to person, place, and time.      UC Treatments / Results  Labs (all labs ordered are listed, but only abnormal results are displayed) Labs Reviewed  NOVEL CORONAVIRUS, NAA  CULTURE, GROUP A STREP [facility])  POCT RAPID STREP A (OFFICE)    EKG   Radiology No results found.  Procedures Procedures (including critical care time)  Medications Ordered in UC Medications - No data to display  Initial Impression / Assessment and Plan / UC Course  I have reviewed the triage vital signs and the nursing notes.  Pertinent labs & imaging results that were  available during my care of the patient were reviewed by me and considered in my medical decision making (see chart for details).    Rapid strep negative. COVID PCR test ordered. Patient to quarantine until testing results return. No alarming signs on exam. Symptomatic treatment discussed.  Push fluids.  Return precautions given.  Patient expresses understanding and agrees to plan.  Final Clinical Impressions(s) / UC Diagnoses   Final diagnoses:  Encounter for screening for COVID-19  Sore throat  Acute intractable headache, unspecified headache type    ED Prescriptions    Medication Sig Dispense Auth. Provider   lidocaine (XYLOCAINE) 2 % solution 5-15 mL gurgle as needed 150 mL Ok Edwards, PA-C     PDMP not reviewed this encounter.   Ok Edwards, PA-C 08/07/20 [phone]

## 2020-08-07 NOTE — ED Triage Notes (Signed)
Pt requesting COVID testing. [place] and developed a sore throat, headache, and fatigue on Saturday. States is fully covid vaccinated.

## 2020-08-07 NOTE — Discharge Instructions (Addendum)
Rapid strep negative. COVID PCR testing ordered. I would like you to quarantine until testing results. Start lidocaine for sore throat, do not eat or drink for the next 40 mins after use as it can stunt your gag reflex. You can take over the counter flonase/nasacort to help with nasal congestion/drainage. Tylenol/motrin for pain and fever. Keep hydrated, urine should be clear to pale yellow in color. If experiencing shortness of breath, trouble breathing, go to the emergency department for further evaluation needed.   For sore throat/cough try using a honey-based tea. Use 3 teaspoons of honey with juice squeezed from half lemon. Place shaved pieces of ginger into 1/2-1 cup of water and warm over stove top. Then mix the ingredients and repeat every 4 hours as needed.

## 2020-08-09 LAB — NOVEL CORONAVIRUS, NAA: SARS-CoV-2, NAA: NOT DETECTED

## 2020-08-09 LAB — SARS-COV-2, NAA 2 DAY TAT

## 2020-08-10 LAB — CULTURE, GROUP A STREP (THRC)

## 2021-09-14 DIAGNOSIS — Z3202 Encounter for pregnancy test, result negative: Secondary | ICD-10-CM | POA: Diagnosis not present

## 2021-09-14 DIAGNOSIS — E669 Obesity, unspecified: Secondary | ICD-10-CM | POA: Diagnosis not present

## 2021-09-14 DIAGNOSIS — Z713 Dietary counseling and surveillance: Secondary | ICD-10-CM | POA: Diagnosis not present

## 2021-09-14 DIAGNOSIS — Z724 Inappropriate diet and eating habits: Secondary | ICD-10-CM | POA: Diagnosis not present

## 2021-09-14 DIAGNOSIS — K5900 Constipation, unspecified: Secondary | ICD-10-CM | POA: Diagnosis not present

## 2021-09-21 DIAGNOSIS — K5900 Constipation, unspecified: Secondary | ICD-10-CM | POA: Diagnosis not present

## 2021-09-21 DIAGNOSIS — N393 Stress incontinence (female) (male): Secondary | ICD-10-CM | POA: Diagnosis not present

## 2021-09-21 DIAGNOSIS — E669 Obesity, unspecified: Secondary | ICD-10-CM | POA: Diagnosis not present

## 2021-09-21 DIAGNOSIS — Z713 Dietary counseling and surveillance: Secondary | ICD-10-CM | POA: Diagnosis not present

## 2021-10-04 DIAGNOSIS — Z713 Dietary counseling and surveillance: Secondary | ICD-10-CM | POA: Diagnosis not present

## 2021-10-04 DIAGNOSIS — K5900 Constipation, unspecified: Secondary | ICD-10-CM | POA: Diagnosis not present

## 2021-10-04 DIAGNOSIS — E669 Obesity, unspecified: Secondary | ICD-10-CM | POA: Diagnosis not present

## 2021-10-04 DIAGNOSIS — N393 Stress incontinence (female) (male): Secondary | ICD-10-CM | POA: Diagnosis not present

## 2021-10-11 DIAGNOSIS — N393 Stress incontinence (female) (male): Secondary | ICD-10-CM | POA: Diagnosis not present

## 2021-10-11 DIAGNOSIS — E669 Obesity, unspecified: Secondary | ICD-10-CM | POA: Diagnosis not present

## 2021-10-11 DIAGNOSIS — Z3202 Encounter for pregnancy test, result negative: Secondary | ICD-10-CM | POA: Diagnosis not present

## 2021-10-11 DIAGNOSIS — Z713 Dietary counseling and surveillance: Secondary | ICD-10-CM | POA: Diagnosis not present

## 2021-10-11 DIAGNOSIS — K5900 Constipation, unspecified: Secondary | ICD-10-CM | POA: Diagnosis not present

## 2021-10-27 DIAGNOSIS — K5900 Constipation, unspecified: Secondary | ICD-10-CM | POA: Diagnosis not present

## 2021-10-27 DIAGNOSIS — E669 Obesity, unspecified: Secondary | ICD-10-CM | POA: Diagnosis not present

## 2021-10-27 DIAGNOSIS — N393 Stress incontinence (female) (male): Secondary | ICD-10-CM | POA: Diagnosis not present

## 2021-10-27 DIAGNOSIS — Z3202 Encounter for pregnancy test, result negative: Secondary | ICD-10-CM | POA: Diagnosis not present

## 2021-11-13 DIAGNOSIS — Z1231 Encounter for screening mammogram for malignant neoplasm of breast: Secondary | ICD-10-CM | POA: Diagnosis not present

## 2021-11-13 LAB — HM MAMMOGRAPHY

## 2022-03-07 ENCOUNTER — Telehealth: Payer: Self-pay

## 2022-03-07 NOTE — Telephone Encounter (Signed)
Okay to schedule re-establish appt.  ?

## 2022-03-07 NOTE — Telephone Encounter (Signed)
yes

## 2022-03-07 NOTE — Telephone Encounter (Signed)
Appointment made for 03/27. ?

## 2022-03-07 NOTE — Telephone Encounter (Signed)
Please advise 

## 2022-03-07 NOTE — Telephone Encounter (Signed)
Patient would like to re-establish care with Dr. Larose Kells. Is this ok? ?

## 2022-03-18 ENCOUNTER — Encounter: Payer: Self-pay | Admitting: Gastroenterology

## 2022-03-18 ENCOUNTER — Encounter: Payer: Self-pay | Admitting: Internal Medicine

## 2022-03-18 ENCOUNTER — Ambulatory Visit (INDEPENDENT_AMBULATORY_CARE_PROVIDER_SITE_OTHER): Payer: BC Managed Care – PPO | Admitting: Internal Medicine

## 2022-03-18 VITALS — BP 126/78 | HR 78 | Temp 98.9°F | Resp 16 | Ht 65.0 in | Wt 213.4 lb

## 2022-03-18 DIAGNOSIS — Z0000 Encounter for general adult medical examination without abnormal findings: Secondary | ICD-10-CM | POA: Diagnosis not present

## 2022-03-18 DIAGNOSIS — E041 Nontoxic single thyroid nodule: Secondary | ICD-10-CM

## 2022-03-18 DIAGNOSIS — D649 Anemia, unspecified: Secondary | ICD-10-CM

## 2022-03-18 DIAGNOSIS — R739 Hyperglycemia, unspecified: Secondary | ICD-10-CM

## 2022-03-18 DIAGNOSIS — Z1211 Encounter for screening for malignant neoplasm of colon: Secondary | ICD-10-CM

## 2022-03-18 LAB — CBC WITH DIFFERENTIAL/PLATELET
Basophils Absolute: 0.1 10*3/uL (ref 0.0–0.1)
Basophils Relative: 1 % (ref 0.0–3.0)
Eosinophils Absolute: 0.2 10*3/uL (ref 0.0–0.7)
Eosinophils Relative: 2.6 % (ref 0.0–5.0)
HCT: 36.7 % (ref 36.0–46.0)
Hemoglobin: 12.3 g/dL (ref 12.0–15.0)
Lymphocytes Relative: 30.4 % (ref 12.0–46.0)
Lymphs Abs: 1.9 10*3/uL (ref 0.7–4.0)
MCHC: 33.6 g/dL (ref 30.0–36.0)
MCV: 88.1 fl (ref 78.0–100.0)
Monocytes Absolute: 0.4 10*3/uL (ref 0.1–1.0)
Monocytes Relative: 7 % (ref 3.0–12.0)
Neutro Abs: 3.8 10*3/uL (ref 1.4–7.7)
Neutrophils Relative %: 59 % (ref 43.0–77.0)
Platelets: 331 10*3/uL (ref 150.0–400.0)
RBC: 4.16 Mil/uL (ref 3.87–5.11)
RDW: 14.8 % (ref 11.5–15.5)
WBC: 6.4 10*3/uL (ref 4.0–10.5)

## 2022-03-18 LAB — HEMOGLOBIN A1C: Hgb A1c MFr Bld: 5.8 % (ref 4.6–6.5)

## 2022-03-18 NOTE — Patient Instructions (Addendum)
Start vitamin D OTC: 2000 units daily ? ?Watch your diet, portion control, smart choices. ? ?Try to be active 3 hours a week, start gradually. ? ?GO TO THE LAB : Get the blood work   ? ? ?[place], [place] ?Come back for   a physical exam in 1 year ? ? ?STOP BY THE FIRST FLOOR: Schedule a thyroid ultrasound ? ? ? ? ?

## 2022-03-18 NOTE — Assessment & Plan Note (Signed)
-   Td  8- 2020 ?- Covid vax : booster rec    ?-Shingrix vaccine: She is somewhat hesitant, we agreed she will do the COVID-vaccine first and then do the Shingrix next year or sooner if she so desires. ?- Female care: ?Gyn Dr Edwyna Ready, last  MMG 03-2020 [facility]) but pt states had one in 2022 ?Period irregular, she thinks is perimenopausal  ?- Bones: rec vit D and exercise , no need for dexa at this time  ?--Cscope 2007, normal, +FH,  refer to GI  ?Diet exercise discussed ?Labs: CMP, FLP, CBC, iron, ferritin, A1c, TSH, T3, T4, vit d ? ? ? ?

## 2022-03-18 NOTE — Progress Notes (Signed)
? ?Subjective:  ? ? Patient ID: Betty Hines, female    DOB: May 04, 1971, 51 y.o.   MRN: [id number] ? ?DOS:  03/18/2022 ?Type of visit - description: New patient, to reestablish, CPX ? ?Since the last office visit is doing well, has no major concerns. ?She has been quite inactive, admits to some DOE when she run up a flight of stairs or do something strenuous.  No chest pain, no palpitation, no lower extremity edema ? ?Review of Systems ? ?Other than above, a 14 point review of systems is negative  ?  ? ? ?History reviewed. No pertinent past medical history. ? ?Past Surgical History:  ?Procedure Laterality Date  ? CESAREAN SECTION    ? x2  ? ?Social History  ? ?Socioeconomic History  ? Marital status: Married  ?  Spouse name: Not on file  ? Number of children: 2  ? Years of education: Not on file  ? Highest education level: Not on file  ?Occupational History  ? Occupation: HR , [employer]  ?Tobacco Use  ? Smoking status: Never  ? Smokeless tobacco: Never  ?Substance and Sexual Activity  ? Alcohol use: No  ? Drug use: No  ? Sexual activity: Not on file  ?Other Topics Concern  ? Not on file  ?Social History Narrative  ? Household husband and daughter   ? 1999, daughter, college [employer]  ? 2002, son : [place]   ? ?Social Determinants of Health  ? ?Financial Resource Strain: Not on file  ?Food Insecurity: Not on file  ?Transportation Needs: Not on file  ?Physical Activity: Not on file  ?Stress: Not on file  ?Social Connections: Not on file  ?Intimate Partner Violence: Not on file  ? ? ?Current Outpatient Medications  ?Medication Instructions  ? Cholecalciferol (VITAMIN D) 50 MCG (2000 UT) CAPS Oral  ? lidocaine (XYLOCAINE) 2 % solution 5-15 mL gurgle as needed  ? Multiple Vitamin (MULTIVITAMIN) LIQD 5 mLs, Oral, Daily  ? Omega-3 Fatty Acids (FISH OIL PO) Oral  ? ? ?   ?Objective:  ? Physical Exam ?BP 126/78 (BP Location: Left Arm, Patient Position: Sitting, Cuff Size: Small)   Pulse 78   Temp 98.9 ?F (37.2 ?C)  (Oral)   Resp 16   Ht '5\' 5"'$  (1.651 m)   Wt 213 lb 6 oz (96.8 kg)   LMP 02/11/2022   SpO2 98%   BMI 35.51 kg/m?  ?General: ?Well developed, NAD, BMI noted ?Neck: No  thyromegaly  ?HEENT:  ?Normocephalic . Face symmetric, atraumatic ?Lungs:  ?CTA B ?Normal respiratory effort, no intercostal retractions, no accessory muscle use. ?Heart: RRR,  no murmur.  ?Abdomen:  ?Not distended, soft, non-tender. No rebound or rigidity.   ?Lower extremities: no pretibial edema bilaterally  ?Skin: Exposed areas without rash. Not pale. Not jaundice ?Neurologic:  ?alert & oriented X3.  ?Speech normal, gait appropriate for age and unassisted ?Strength symmetric and appropriate for age.  ?Psych: ?Cognition and judgment appear intact.  ?Cooperative with normal attention span and concentration.  ?Behavior appropriate. ?No anxious or depressed appearing. ? ?   ?Assessment   ? ?Assessment ?Thyroid nodule ?Contraception- husband vasectomy ?  ?Plan: ?To reestablish, CPX, last seen 2019. ?Thyroid nodule: Last [place] was 02/28/2017, overdue for recheck ultrasound.  Will arrange.  Check TFTs ?Mild anemia: Per chart review, rechecking a CBC, iron, ferritin.  No GI symptoms. ?RTC 1 year ?  ? ?This visit occurred during the SARS-CoV-2 public health emergency.  Safety protocols were  in place, including screening questions prior to the visit, additional usage of staff PPE, and extensive cleaning of exam room while observing appropriate contact time as indicated for disinfecting solutions.  ? ?

## 2022-03-18 NOTE — Assessment & Plan Note (Signed)
To reestablish, CPX, last seen 2019. ?Thyroid nodule: Last [place] was 02/28/2017, overdue for recheck ultrasound.  Will arrange.  Check TFTs ?Mild anemia: Per chart review, rechecking a CBC, iron, ferritin.  No GI symptoms. ?RTC 1 year ?  ?

## 2022-03-19 ENCOUNTER — Telehealth (HOSPITAL_BASED_OUTPATIENT_CLINIC_OR_DEPARTMENT_OTHER): Payer: Self-pay

## 2022-03-19 ENCOUNTER — Ambulatory Visit (HOSPITAL_BASED_OUTPATIENT_CLINIC_OR_DEPARTMENT_OTHER): Admission: RE | Admit: 2022-03-19 | Payer: BC Managed Care – PPO | Source: Ambulatory Visit

## 2022-03-19 LAB — COMPREHENSIVE METABOLIC PANEL
ALT: 19 U/L (ref 0–35)
AST: 18 U/L (ref 0–37)
Albumin: 4.3 g/dL (ref 3.5–5.2)
Alkaline Phosphatase: 47 U/L (ref 39–117)
BUN: 12 mg/dL (ref 6–23)
CO2: 28 mEq/L (ref 19–32)
Calcium: 9.4 mg/dL (ref 8.4–10.5)
Chloride: 102 mEq/L (ref 96–112)
Creatinine, Ser: 0.88 mg/dL (ref 0.40–1.20)
GFR: 75.42 mL/min (ref >60.00)
Glucose, Bld: 98 mg/dL (ref 70–99)
Potassium: 4.2 mEq/L (ref 3.5–5.1)
Sodium: 136 mEq/L (ref 135–145)
Total Bilirubin: 0.2 mg/dL (ref 0.2–1.2)
Total Protein: 7.3 g/dL (ref 6.0–8.3)

## 2022-03-19 LAB — IBC + FERRITIN
Ferritin: 8.1 ng/mL — ABNORMAL LOW (ref 10.0–291.0)
Iron: 47 ug/dL (ref 42–145)
Saturation Ratios: 10 % — ABNORMAL LOW (ref 20.0–50.0)
TIBC: 470.4 ug/dL — ABNORMAL HIGH (ref 250.0–450.0)
Transferrin: 336 mg/dL (ref 212.0–360.0)

## 2022-03-19 LAB — LIPID PANEL
Cholesterol: 218 mg/dL — ABNORMAL HIGH (ref 0–200)
HDL: 71.2 mg/dL (ref >39.00)
LDL Cholesterol: 107 mg/dL — ABNORMAL HIGH (ref 0–99)
NonHDL: 146.81
Total CHOL/HDL Ratio: 3
Triglycerides: 197 mg/dL — ABNORMAL HIGH (ref 0.0–149.0)
VLDL: 39.4 mg/dL (ref 0.0–40.0)

## 2022-03-19 LAB — T3, FREE: T3, Free: 3.3 pg/mL (ref 2.3–4.2)

## 2022-03-19 LAB — VITAMIN D 25 HYDROXY (VIT D DEFICIENCY, FRACTURES): VITD: 16.62 ng/mL — ABNORMAL LOW (ref 30.00–100.00)

## 2022-03-19 LAB — T4, FREE: Free T4: 0.62 ng/dL (ref 0.60–1.60)

## 2022-03-19 LAB — TSH: TSH: 4.28 u[IU]/mL (ref 0.35–5.50)

## 2022-03-21 ENCOUNTER — Encounter: Payer: Self-pay | Admitting: Internal Medicine

## 2022-03-22 ENCOUNTER — Telehealth (HOSPITAL_BASED_OUTPATIENT_CLINIC_OR_DEPARTMENT_OTHER): Payer: Self-pay

## 2022-03-22 ENCOUNTER — Other Ambulatory Visit: Payer: Self-pay

## 2022-03-22 DIAGNOSIS — E559 Vitamin D deficiency, unspecified: Secondary | ICD-10-CM

## 2022-03-22 DIAGNOSIS — D649 Anemia, unspecified: Secondary | ICD-10-CM

## 2022-03-22 MED ORDER — VITAMIN D (ERGOCALCIFEROL) 1.25 MG (50000 UNIT) PO CAPS: 50000.0000 [IU] | ORAL_CAPSULE | ORAL | 2 refills | Status: DC

## 2022-03-22 MED ORDER — IRON (FERROUS SULFATE) 325 (65 FE) MG PO TABS: 325.0000 mg | ORAL_TABLET | Freq: Every day | ORAL | 5 refills | Status: DC

## 2022-03-22 MED ORDER — IRON (FERROUS SULFATE) 325 (65 FE) MG PO TABS: 325.0000 mg | ORAL_TABLET | Freq: Two times a day (BID) | ORAL | 5 refills | Status: DC

## 2022-04-16 ENCOUNTER — Ambulatory Visit (AMBULATORY_SURGERY_CENTER): Payer: BC Managed Care – PPO | Admitting: *Deleted

## 2022-04-16 VITALS — Ht 65.0 in | Wt 212.0 lb

## 2022-04-16 DIAGNOSIS — Z1211 Encounter for screening for malignant neoplasm of colon: Secondary | ICD-10-CM

## 2022-04-16 MED ORDER — NA SULFATE-K SULFATE-MG SULF 17.5-3.13-1.6 GM/177ML PO SOLN: 1.0000 | Freq: Once | ORAL | 0 refills | Status: AC

## 2022-04-16 NOTE — Progress Notes (Signed)

## 2022-04-30 ENCOUNTER — Encounter: Payer: Self-pay | Admitting: Gastroenterology

## 2022-05-07 ENCOUNTER — Ambulatory Visit (AMBULATORY_SURGERY_CENTER): Payer: BC Managed Care – PPO | Admitting: Gastroenterology

## 2022-05-07 ENCOUNTER — Encounter: Payer: Self-pay | Admitting: Gastroenterology

## 2022-05-07 VITALS — BP 120/64 | HR 74 | Temp 98.0°F | Resp 10 | Ht 65.0 in | Wt 212.0 lb

## 2022-05-07 DIAGNOSIS — D124 Benign neoplasm of descending colon: Secondary | ICD-10-CM

## 2022-05-07 DIAGNOSIS — Z800 Family history of malignant neoplasm of digestive organs: Secondary | ICD-10-CM

## 2022-05-07 DIAGNOSIS — Z1211 Encounter for screening for malignant neoplasm of colon: Secondary | ICD-10-CM

## 2022-05-07 MED ORDER — SODIUM CHLORIDE 0.9 % IV SOLN: 500.0000 mL | Freq: Once | INTRAVENOUS | Status: DC

## 2022-05-07 NOTE — Patient Instructions (Signed)
Resume previous medications.  2 polyps removed and sent to pathology.  Await results for final recommendations.   ? ?Handouts on findings given to patient.   (Polyps, diverticulosis) ? ?YOU HAD AN ENDOSCOPIC PROCEDURE TODAY AT [facility]:   Refer to the procedure report that was given to you for any specific questions about what was found during the examination.  If the procedure report does not answer your questions, please call your gastroenterologist to clarify.  If you requested that your care partner not be given the details of your procedure findings, then the procedure report has been included in a sealed envelope for you to review at your convenience later. ? ?YOU SHOULD EXPECT: Some feelings of bloating in the abdomen. Passage of more gas than usual.  Walking can help get rid of the air that was put into your GI tract during the procedure and reduce the bloating. If you had a lower endoscopy (such as a colonoscopy or flexible sigmoidoscopy) you may notice spotting of blood in your stool or on the toilet paper. If you underwent a bowel prep for your procedure, you may not have a normal bowel movement for a few days. ? ?Please Note:  You might notice some irritation and congestion in your nose or some drainage.  This is from the oxygen used during your procedure.  There is no need for concern and it should clear up in a day or so. ? ?SYMPTOMS TO REPORT IMMEDIATELY: ? ?Following lower endoscopy (colonoscopy or flexible sigmoidoscopy): ? Excessive amounts of blood in the stool ? Significant tenderness or worsening of abdominal pains ? Swelling of the abdomen that is new, acute ? Fever of 100?F or higher ? ? ?For urgent or emergent issues, a gastroenterologist can be reached at any hour by calling [phone]. ?Do not use MyChart messaging for urgent concerns.  ? ? ?DIET:  We do recommend a small meal at first, but then you may proceed to your regular diet.  Drink plenty of fluids but you  should avoid alcoholic beverages for 24 hours. ? ?ACTIVITY:  You should plan to take it easy for the rest of today and you should NOT DRIVE or use heavy machinery until tomorrow (because of the sedation medicines used during the test).   ? ?FOLLOW UP: ?Our staff will call the number listed on your records 48-72 hours following your procedure to check on you and address any questions or concerns that you may have regarding the information given to you following your procedure. If we do not reach you, we will leave a message.  We will attempt to reach you two times.  During this call, we will ask if you have developed any symptoms of COVID 19. If you develop any symptoms (ie: fever, flu-like symptoms, shortness of breath, cough etc.) before then, please call [phone].  If you test positive for Covid 19 in the 2 weeks post procedure, please call and report this information to [place].   ? ?If any biopsies were taken you will be contacted by phone or by letter within the next 1-3 weeks.  Please call us at [phone] if you have not heard about the biopsies in 3 weeks.  ? ? ?SIGNATURES/CONFIDENTIALITY: ?You and/or your care partner have signed paperwork which will be entered into your electronic medical record.  These signatures attest to the fact that that the information above on your After Visit Summary has been reviewed and is understood.  Full responsibility of  the confidentiality of this discharge information lies with you and/or your care-partner.  ?

## 2022-05-07 NOTE — Progress Notes (Signed)
Colonoscopy 2007 for FH of CRC, "three aunts and one uncle" was normal. ? ?HPI: ?This is a woman with FH CRC ? ? ?ROS: complete GI ROS as described in HPI, all other review negative. ? ?Constitutional:  No unintentional weight loss ? ? ?Past Medical History:  ?Diagnosis Date  ? Allergy   ? seasonal  ? Anemia   ? Anxiety   ? Depression   ? Hyperlipidemia   ? ? ?Past Surgical History:  ?Procedure Laterality Date  ? CERVICAL CERCLAGE    ? CESAREAN SECTION    ? x2  ? WISDOM TOOTH EXTRACTION    ? ? ?Current Outpatient Medications  ?Medication Sig Dispense Refill  ? Green Tea, Camellia sinensis, (GREEN TEA PO) Take by mouth daily. Take 2 soft gel capsules daily    ? Iron, Ferrous Sulfate, 325 (65 Fe) MG TABS Take 325 mg by mouth 2 (two) times daily. (Patient not taking: Reported on 04/16/2022) 60 tablet 5  ? Multiple Vitamin (MULTIVITAMIN) LIQD Take 5 mLs by mouth daily.    ? Omega-3 Fatty Acids (FISH OIL PO) Take by mouth daily.    ? Vitamin D, Ergocalciferol, (DRISDOL) 1.25 MG (50000 UNIT) CAPS capsule Take 1 capsule (50,000 Units total) by mouth every 7 (seven) days. 5 capsule 2  ? ?Current Facility-Administered Medications  ?Medication Dose Route Frequency Provider Last Rate Last Admin  ? 0.9 %  sodium chloride infusion  500 mL Intravenous Once Milus Banister, MD      ? ?Facility-Administered Medications Ordered in Other Visits  ?Medication Dose Route Frequency Provider Last Rate Last Admin  ? iopamidol (ISOVUE-300) 61 % injection 100 mL  100 mL Intravenous Once PRN Noe Gens, PA-C      ? ? ?Allergies as of 05/07/2022 - Review Complete 05/07/2022  ?Allergen Reaction Noted  ? Penicillins  08/10/2007  ? ? ?Family History  ?Adopted: Yes  ?Problem Relation Age of Onset  ? Colon cancer Other   ?     2 aunts and 1 uncle , youngest index case??  ? Breast cancer Neg Hx   ? Coronary artery disease Neg Hx   ? Diabetes Neg Hx   ? Esophageal cancer Neg Hx   ? Rectal cancer Neg Hx   ? Stomach cancer Neg Hx   ? Crohn's  disease Neg Hx   ? ? ?Social History  ? ?Socioeconomic History  ? Marital status: Married  ?  Spouse name: Not on file  ? Number of children: 2  ? Years of education: Not on file  ? Highest education level: Not on file  ?Occupational History  ? Occupation: HR , [employer]  ?Tobacco Use  ? Smoking status: Never  ? Smokeless tobacco: Never  ?Vaping Use  ? Vaping Use: Never used  ?Substance and Sexual Activity  ? Alcohol use: No  ? Drug use: No  ? Sexual activity: Not on file  ?Other Topics Concern  ? Not on file  ?Social History Narrative  ? Household husband and daughter   ? 1999, daughter, college [employer]  ? 2002, son : [place]   ? ?Social Determinants of Health  ? ?Financial Resource Strain: Not on file  ?Food Insecurity: Not on file  ?Transportation Needs: Not on file  ?Physical Activity: Not on file  ?Stress: Not on file  ?Social Connections: Not on file  ?Intimate Partner Violence: Not on file  ? ? ? ?Physical Exam: ?BP (!) 151/95   Pulse 85  Temp 98 ?F (36.7 ?C) (Skin)   Ht '5\' 5"'$  (1.651 m)   Wt 212 lb (96.2 kg)   SpO2 96%   BMI 35.28 kg/m?  ?Constitutional: generally well-appearing ?Psychiatric: alert and oriented x3 ?Lungs: CTA bilaterally ?Heart: no MCR ? ?Assessment and plan: ?51 y.o. female with FH of CRC ? ?Screening colonoscopy today ? ?Care is appropriate for the ambulatory setting. ? ?Owens Loffler, MD ?[facility] ?05/07/2022, 7:51 AM ? ? ? ?

## 2022-05-07 NOTE — Progress Notes (Signed)
Pt non-responsive, VVS, Report to RN  °

## 2022-05-07 NOTE — Op Note (Signed)
[place] ?Patient Name: Betty Hines ?Procedure Date: 05/07/2022 8:11 AM ?MRN: [id number] ?Endoscopist: Milus Banister , MD ?Age: 51 ?Referring MD:  ?Date of Birth: 1971-11-08 ?Gender: Female ?Account #: [id number] ?Procedure:                Colonoscopy ?Indications:              Colon cancer screening in patient at increased  ?                          risk: Family history of colorectal cancer in  ?                          multiple 2nd degree relatives (three aunts and one  ?                          uncle) ?Medicines:                Monitored Anesthesia Care ?Procedure:                Pre-Anesthesia Assessment: ?                          - Prior to the procedure, a History and Physical  ?                          was performed, and patient medications and  ?                          allergies were reviewed. The patient's tolerance of  ?                          previous anesthesia was also reviewed. The risks  ?                          and benefits of the procedure and the sedation  ?                          options and risks were discussed with the patient.  ?                          All questions were answered, and informed consent  ?                          was obtained. Prior Anticoagulants: The patient has  ?                          taken no previous anticoagulant or antiplatelet  ?                          agents. ASA Grade Assessment: II - A patient with  ?                          mild systemic disease. After reviewing the risks  ?  and benefits, the patient was deemed in  ?                          satisfactory condition to undergo the procedure. ?                          After obtaining informed consent, the colonoscope  ?                          was passed under direct vision. Throughout the  ?                          procedure, the patient's blood pressure, pulse, and  ?                          oxygen saturations were monitored continuously. The  ?                           CF [id number] #[id number] was introduced through the anus  ?                          and advanced to the the cecum, identified by  ?                          appendiceal orifice and ileocecal valve. The  ?                          colonoscopy was performed without difficulty. The  ?                          patient tolerated the procedure well. The quality  ?                          of the bowel preparation was excellent. ?Scope In: 8:23:45 AM ?Scope Out: 8:33:58 AM ?Scope Withdrawal Time: 0 hours 7 minutes 59 seconds  ?Total Procedure Duration: 0 hours 10 minutes 13 seconds  ?Findings:                 Two semi-pedunculated polyps were found in the  ?                          descending colon. The polyps were 4 to 6 mm in  ?                          size. These polyps were removed with a cold snare.  ?                          Resection and retrieval were complete. ?                          Multiple small and large-mouthed diverticula were  ?                          found in the left colon. ?  The exam was otherwise without abnormality on  ?                          direct and retroflexion views. ?Complications:            No immediate complications. Estimated blood loss:  ?                          None. ?Estimated Blood Loss:     Estimated blood loss: none. ?Impression:               - Two 4 to 6 mm polyps in the descending colon,  ?                          removed with a cold snare. Resected and retrieved. ?                          - Diverticulosis in the left colon. ?                          - The examination was otherwise normal on direct  ?                          and retroflexion views. ?Recommendation:           - Patient has a contact number available for  ?                          emergencies. The signs and symptoms of potential  ?                          delayed complications were discussed with the  ?                          patient. Return to normal  activities tomorrow.  ?                          Written discharge instructions were provided to the  ?                          patient. ?                          - Resume previous diet. ?                          - Continue present medications. ?                          - Await pathology results. ?Milus Banister, MD ?05/07/2022 8:36:28 AM ?This report has been signed electronically. ?

## 2022-05-09 ENCOUNTER — Telehealth: Payer: Self-pay | Admitting: *Deleted

## 2022-05-09 NOTE — Telephone Encounter (Signed)
  Follow up Call-     05/07/2022    7:49 AM  Call back number  Post procedure Call Back phone  # [phone]  Permission to leave phone message Yes     Patient questions:  Do you have a fever, pain , or abdominal swelling? No. Pain Score  0 *  Have you tolerated food without any problems? Yes.    Have you been able to return to your normal activities? Yes.    Do you have any questions about your discharge instructions: Diet   No. Medications  No. Follow up visit  No.  Do you have questions or concerns about your Care? No.  Actions: * If pain score is 4 or above: No action needed, pain <4.

## 2022-05-13 ENCOUNTER — Encounter: Payer: Self-pay | Admitting: Gastroenterology

## 2022-07-16 ENCOUNTER — Other Ambulatory Visit: Payer: Self-pay | Admitting: Internal Medicine

## 2022-07-16 DIAGNOSIS — E559 Vitamin D deficiency, unspecified: Secondary | ICD-10-CM

## 2022-08-23 ENCOUNTER — Other Ambulatory Visit: Payer: Self-pay | Admitting: Internal Medicine

## 2022-08-23 DIAGNOSIS — E559 Vitamin D deficiency, unspecified: Secondary | ICD-10-CM

## 2022-09-09 ENCOUNTER — Other Ambulatory Visit: Payer: Self-pay | Admitting: Internal Medicine

## 2022-09-09 DIAGNOSIS — E559 Vitamin D deficiency, unspecified: Secondary | ICD-10-CM

## 2022-09-23 ENCOUNTER — Ambulatory Visit (INDEPENDENT_AMBULATORY_CARE_PROVIDER_SITE_OTHER): Payer: BC Managed Care – PPO | Admitting: Internal Medicine

## 2022-09-23 ENCOUNTER — Encounter: Payer: Self-pay | Admitting: Internal Medicine

## 2022-09-23 VITALS — BP 126/82 | HR 89 | Temp 97.9°F | Resp 16 | Ht 65.0 in | Wt 219.0 lb

## 2022-09-23 DIAGNOSIS — E559 Vitamin D deficiency, unspecified: Secondary | ICD-10-CM

## 2022-09-23 DIAGNOSIS — D649 Anemia, unspecified: Secondary | ICD-10-CM

## 2022-09-23 DIAGNOSIS — D509 Iron deficiency anemia, unspecified: Secondary | ICD-10-CM

## 2022-09-23 DIAGNOSIS — R739 Hyperglycemia, unspecified: Secondary | ICD-10-CM | POA: Diagnosis not present

## 2022-09-23 LAB — IBC + FERRITIN
Ferritin: 45.1 ng/mL (ref 10.0–291.0)
Iron: 88 ug/dL (ref 42–145)
Saturation Ratios: 25.8 % (ref 20.0–50.0)
TIBC: 341.6 ug/dL (ref 250.0–450.0)
Transferrin: 244 mg/dL (ref 212.0–360.0)

## 2022-09-23 LAB — VITAMIN D 25 HYDROXY (VIT D DEFICIENCY, FRACTURES): VITD: 26.26 ng/mL — ABNORMAL LOW (ref 30.00–100.00)

## 2022-09-23 NOTE — Assessment & Plan Note (Signed)
Hyperglycemia: Last A1c 5.8 Iron deficiency anemia: Last ferritin was quite low, had a colonoscopy 04/2022, + polyps, next per GI.  Unable to take iron supplements due to his constipation, has changed his diet , lots of iron-rich foods. We will check labs, depending on results consider to take iron twice weekly. Thyroid nodule: [place] recommended, not done d/t cost.  Patient is planning to proceed at some point Vitamin D deficiency: Had ergocalciferol, not taking supplements, rec to start OTC 2000 units and check labs. Vaccines: Recommend flu and COVID booster. RTC 5 months CPX

## 2022-09-23 NOTE — Progress Notes (Signed)
   Subjective:    Patient ID: Betty Hines, female    DOB: 11/17/1971, 51 y.o.   MRN: [id number]  DOS:  09/23/2022 Type of visit - description: f/u  In general feels well. Was found to have iron deficiency. Had a colonoscopy, report reviewed. She is unable to take iron supplements due to constipation. Denies any blood donation recently. Premenopausal -- menses are not regular, sometimes heavy, sometimes only spotting. No GI symptoms including no diarrhea, GERD or abdominal pain.  Review of Systems See above   Past Medical History:  Diagnosis Date   Allergy    seasonal   Anemia    Anxiety    Depression    Hyperlipidemia     Past Surgical History:  Procedure Laterality Date   CERVICAL CERCLAGE     CESAREAN SECTION     x2   COLONOSCOPY     2007   WISDOM TOOTH EXTRACTION      Current Outpatient Medications  Medication Instructions   cholecalciferol (VITAMIN D3) 2,000 Units, Oral, Daily   Green Tea, Camellia sinensis, (GREEN TEA PO) Oral, Daily, Take 2 soft gel capsules daily   Multiple Vitamin (MULTIVITAMIN) LIQD 5 mLs, Oral, Daily   Omega-3 Fatty Acids (FISH OIL PO) Oral, Daily       Objective:   Physical Exam BP 126/82   Pulse 89   Temp 97.9 F (36.6 C) (Oral)   Resp 16   Ht '5\' 5"'$  (1.651 m)   Wt 219 lb (99.3 kg)   LMP 07/24/2022 (Exact Date)   SpO2 96%   BMI 36.44 kg/m  General:   Well developed, NAD, BMI noted.  HEENT:  Normocephalic . Face symmetric, atraumatic Neck: Thyroid barely palpable, nontender, not nodular Lungs:  CTA B Normal respiratory effort, no intercostal retractions, no accessory muscle use. Heart: RRR,  no murmur.  Abdomen:  Not distended, soft, non-tender. No rebound or rigidity.   Skin: Not pale. Not jaundice Lower extremities: no pretibial edema bilaterally  Neurologic:  alert & oriented X3.  Speech normal, gait appropriate for age and unassisted Psych--  Cognition and judgment appear intact.  Cooperative with normal  attention span and concentration.  Behavior appropriate. No anxious or depressed appearing.     Assessment     Assessment Glycemia (A1c 5.8) Thyroid nodule Vitamin D deficiency  Plan: Hyperglycemia: Last A1c 5.8 Iron deficiency anemia: Last ferritin was quite low, had a colonoscopy 04/2022, + polyps, next per GI.  Unable to take iron supplements due to his constipation, has changed his diet , lots of iron-rich foods. We will check labs, depending on results consider to take iron twice weekly. Thyroid nodule: [place] recommended, not done d/t cost.  Patient is planning to proceed at some point Vitamin D deficiency: Had ergocalciferol, not taking supplements, rec to start OTC 2000 units and check labs. Vaccines: Recommend flu and COVID booster. RTC 5 months CPX

## 2022-09-23 NOTE — Patient Instructions (Addendum)
Take vitamin D over-the-counter: 2000 units daily  We are checking your iron, if it is low would recommend to take iron supplements twice a week only (to prevent constipation)  Vaccines I recommend:  Covid booster Flu shot    GO TO THE LAB : Get the blood work     GO TO THE FRONT DESK, Arabi back for a physical exam in 6 months

## 2023-02-11 DIAGNOSIS — N914 Secondary oligomenorrhea: Secondary | ICD-10-CM | POA: Diagnosis not present

## 2023-02-11 DIAGNOSIS — Z1231 Encounter for screening mammogram for malignant neoplasm of breast: Secondary | ICD-10-CM | POA: Diagnosis not present

## 2023-02-11 DIAGNOSIS — Z124 Encounter for screening for malignant neoplasm of cervix: Secondary | ICD-10-CM | POA: Diagnosis not present

## 2023-02-11 DIAGNOSIS — Z01419 Encounter for gynecological examination (general) (routine) without abnormal findings: Secondary | ICD-10-CM | POA: Diagnosis not present

## 2023-02-11 DIAGNOSIS — Z6835 Body mass index (BMI) 35.0-35.9, adult: Secondary | ICD-10-CM | POA: Diagnosis not present

## 2023-02-17 LAB — HM PAP SMEAR: HPV, high-risk: NEGATIVE

## 2023-03-21 ENCOUNTER — Encounter: Payer: BC Managed Care – PPO | Admitting: Internal Medicine

## 2023-03-24 ENCOUNTER — Ambulatory Visit
Admission: EM | Admit: 2023-03-24 | Discharge: 2023-03-24 | Disposition: A | Discharge status: Home / Self Care | Payer: BC Managed Care – PPO | Attending: Physician Assistant | Admitting: Physician Assistant

## 2023-03-24 DIAGNOSIS — J069 Acute upper respiratory infection, unspecified: Secondary | ICD-10-CM | POA: Diagnosis not present

## 2023-03-24 DIAGNOSIS — R051 Acute cough: Secondary | ICD-10-CM | POA: Diagnosis not present

## 2023-03-24 DIAGNOSIS — Z1152 Encounter for screening for COVID-19: Secondary | ICD-10-CM | POA: Diagnosis not present

## 2023-03-24 MED ORDER — FLUTICASONE PROPIONATE 50 MCG/ACT NA SUSP: 2.0000 | Freq: Every day | NASAL | 0 refills | Status: DC

## 2023-03-24 MED ORDER — PSEUDOEPH-BROMPHEN-DM 30-2-10 MG/5ML PO SYRP: 5.0000 mL | ORAL_SOLUTION | Freq: Three times a day (TID) | ORAL | 0 refills | Status: DC | PRN

## 2023-03-24 NOTE — ED Provider Notes (Signed)
[name]    CSN: [id number] Arrival date & time: 03/24/23  1704      History   Chief Complaint Chief Complaint  Patient presents with   Cough    HPI Betty Hines is a 52 y.o. female.   52 year old female presents with cough and congestion.  Patient indicates she has attended 2 conferences in the past several days.  She indicates for the past 3 days she has been having upper respiratory congestion, sinus congestion, postnasal drip and rhinitis which is mainly been clear to purulent.  She has had some intermittent frontal headaches with the congestion.  Patient also indicates she has been having intermittent sore throat and painful swallowing.  She indicates she has had some chest congestion with intermittent cough, without wheezing or shortness of breath.  She indicates she did feel like she was running fever couple days ago but it is cleared.  She is tolerating fluids well.  She is concerned about having COVID and desires a COVID test because she is due to go to a conference on Wednesday and she wants to ensure that she is not contagious.  She also relates that she has had a rash on the both side of her neck that has been mildly pruritic but has improved over the past couple days.   Cough   Past Medical History:  Diagnosis Date   Allergy    seasonal   Anemia    Anxiety    Depression    Hyperlipidemia     Patient Active Problem List   Diagnosis Date Noted   Hyperglycemia, unspecified 09/23/2022   PCP NOTES >>>>>>>>>>>>>>>>>> 03/02/2017   Annual physical exam 08/27/2011   COLONOSCOPY, HX OF 08/10/2007    Past Surgical History:  Procedure Laterality Date   CERVICAL CERCLAGE     CESAREAN SECTION     x2   COLONOSCOPY     2007   WISDOM TOOTH EXTRACTION      OB History   No obstetric history on file.      Home Medications    Prior to Admission medications   Medication Sig Start Date End Date Taking? Authorizing Provider   brompheniramine-pseudoephedrine-DM 30-2-10 MG/5ML syrup Take 5 mLs by mouth 3 (three) times daily as needed. 03/24/23  Yes Nyoka Lint, PA-C  fluticasone [facility]) 50 MCG/ACT nasal spray Place 2 sprays into both nostrils daily. 03/24/23  Yes Nyoka Lint, PA-C  cholecalciferol (VITAMIN D3) 25 MCG (1000 UNIT) tablet Take 2,000 Units by mouth daily.    [provider]  Nyoka Cowden Tea, Camellia sinensis, (GREEN TEA PO) Take by mouth daily. Take 2 soft gel capsules daily    [provider]  Multiple Vitamin (MULTIVITAMIN) LIQD Take 5 mLs by mouth daily.    [provider]  Omega-3 Fatty Acids (FISH OIL PO) Take by mouth daily.    [provider]    Family History Family History  Adopted: Yes  Problem Relation Age of Onset   Colon cancer Other        2 aunts and 1 uncle , youngest index case??   Breast cancer Neg Hx    Coronary artery disease Neg Hx    Diabetes Neg Hx    Esophageal cancer Neg Hx    Rectal cancer Neg Hx    Stomach cancer Neg Hx    Crohn's disease Neg Hx     Social History Social History   Tobacco Use   Smoking status: Never   Smokeless tobacco:  Never  Vaping Use   Vaping Use: Never used  Substance Use Topics   Alcohol use: No   Drug use: No     Allergies   Penicillins   Review of Systems Review of Systems  HENT:  Positive for postnasal drip and sinus pressure.   Respiratory:  Positive for cough.      Physical Exam Triage Vital Signs ED Triage Vitals [03/24/23 1759]  Enc Vitals Group     BP (!) 169/97     Pulse Rate 97     Resp 18     Temp 98 F (36.7 C)     Temp Source Oral     SpO2 95 %     Weight      Height      Head Circumference      Peak Flow      Pain Score 0     Pain Loc      Pain Edu?      Excl. in [place]?    No data found.  Updated Vital Signs BP (!) 169/97 (BP Location: Left Arm)   Pulse 97   Temp 98 F (36.7 C) (Oral)   Resp 18   SpO2 95%   Visual Acuity Right Eye Distance:   Left Eye  Distance:   Bilateral Distance:    Right Eye Near:   Left Eye Near:    Bilateral Near:     Physical Exam Constitutional:      Appearance: Normal appearance.  HENT:     Right Ear: Ear canal normal. Tympanic membrane is injected.     Left Ear: Ear canal normal. Tympanic membrane is injected.     Mouth/Throat:     Mouth: Mucous membranes are moist.     Pharynx: Oropharynx is clear. Uvula midline.  Cardiovascular:     Rate and Rhythm: Normal rate and regular rhythm.     Heart sounds: Normal heart sounds.  Pulmonary:     Effort: Pulmonary effort is normal.     Breath sounds: Normal breath sounds and air entry. No wheezing, rhonchi or rales.  Lymphadenopathy:     Cervical: No cervical adenopathy.  Skin:         Comments: Neck: Bilateral neck with diffuse hyperpigmented rash present with out scaling, flat, without redness.  Neurological:     Mental Status: She is alert.      UC Treatments / Results  Labs (all labs ordered are listed, but only abnormal results are displayed) Labs Reviewed  SARS CORONAVIRUS 2 (TAT 6-24 HRS)    EKG   Radiology No results found.  Procedures Procedures (including critical care time)  Medications Ordered in UC Medications - No data to display  Initial Impression / Assessment and Plan / UC Course  I have reviewed the triage vital signs and the nursing notes.  Pertinent labs & imaging results that were available during my care of the patient were reviewed by me and considered in my medical decision making (see chart for details).    Plan: The diagnosis will be treated with the following: 1.  Screening for COVID-19: A.  Treatment may be modified depending on COVID-19 testing results. 2.  Acute upper respiratory infection: A.  Advised take Bromfed-DM, 1 teaspoon every 6 hours as needed for cough and congestion. B.  Flonase nasal spray, 2 sprays each nostril once daily to help decrease congestion. 3.  Advised follow-up PCP return to  urgent care as needed. Final Clinical Impressions(s) / UC  Diagnoses   Final diagnoses:  Encounter for screening for COVID-19  Acute upper respiratory infection  Acute cough     Discharge Instructions      Vies use of Flonase nasal spray, 2 sprays each nostril once daily to help control congestion and drainage. Advised use of Bromfed-DM, 1 teaspoon 3 times a day to help control congestion and cough. Advised take ibuprofen or Motrin as needed for pain and discomfort.  COVID test will be completed in 48 hours.  If you do not get a call from this office that indicates the test is negative.  Log onto MyChart to be the test results with the post in 48 hours.  Advised follow-up PCP return to urgent care as needed.    ED Prescriptions     Medication Sig Dispense Auth. Provider   fluticasone (FLONASE) 50 MCG/ACT nasal spray Place 2 sprays into both nostrils daily. 9.9 mL Nyoka Lint, PA-C   brompheniramine-pseudoephedrine-DM 30-2-10 MG/5ML syrup Take 5 mLs by mouth 3 (three) times daily as needed. 120 mL Nyoka Lint, PA-C      PDMP not reviewed this encounter.   Nyoka Lint, PA-C 03/24/23 1819

## 2023-03-24 NOTE — ED Triage Notes (Signed)
Pt c/o cough, headache, sweating, body aches, chills, malaise, fatigue,   Onset ~ friday   Continue to c/o rash to bilat neck

## 2023-03-24 NOTE — Discharge Instructions (Signed)
Vies use of Flonase nasal spray, 2 sprays each nostril once daily to help control congestion and drainage. Advised use of Bromfed-DM, 1 teaspoon 3 times a day to help control congestion and cough. Advised take ibuprofen or Motrin as needed for pain and discomfort.  COVID test will be completed in 48 hours.  If you do not get a call from this office that indicates the test is negative.  Log onto MyChart to be the test results with the post in 48 hours.  Advised follow-up PCP return to urgent care as needed.

## 2023-03-25 LAB — SARS CORONAVIRUS 2 (TAT 6-24 HRS): SARS Coronavirus 2: POSITIVE — AB

## 2023-04-01 ENCOUNTER — Encounter: Payer: Self-pay | Admitting: Internal Medicine

## 2023-04-27 ENCOUNTER — Ambulatory Visit (HOSPITAL_BASED_OUTPATIENT_CLINIC_OR_DEPARTMENT_OTHER)
Admission: RE | Admit: 2023-04-27 | Discharge: 2023-04-27 | Disposition: A | Discharge status: Home / Self Care | Payer: BC Managed Care – PPO | Source: Ambulatory Visit | Attending: Internal Medicine | Admitting: Internal Medicine

## 2023-04-27 DIAGNOSIS — E041 Nontoxic single thyroid nodule: Secondary | ICD-10-CM | POA: Insufficient documentation

## 2023-05-02 ENCOUNTER — Encounter: Payer: BC Managed Care – PPO | Admitting: Internal Medicine

## 2023-05-07 ENCOUNTER — Ambulatory Visit (INDEPENDENT_AMBULATORY_CARE_PROVIDER_SITE_OTHER): Payer: BC Managed Care – PPO | Admitting: Internal Medicine

## 2023-05-07 ENCOUNTER — Ambulatory Visit (HOSPITAL_BASED_OUTPATIENT_CLINIC_OR_DEPARTMENT_OTHER)
Admission: RE | Admit: 2023-05-07 | Discharge: 2023-05-07 | Disposition: A | Discharge status: Home / Self Care | Payer: BC Managed Care – PPO | Source: Ambulatory Visit | Attending: Internal Medicine | Admitting: Internal Medicine

## 2023-05-07 ENCOUNTER — Encounter: Payer: Self-pay | Admitting: Internal Medicine

## 2023-05-07 VITALS — BP 126/82 | HR 86 | Temp 98.1°F | Resp 16 | Ht 65.0 in | Wt 220.5 lb

## 2023-05-07 DIAGNOSIS — Z0000 Encounter for general adult medical examination without abnormal findings: Secondary | ICD-10-CM | POA: Diagnosis not present

## 2023-05-07 DIAGNOSIS — E559 Vitamin D deficiency, unspecified: Secondary | ICD-10-CM

## 2023-05-07 DIAGNOSIS — D649 Anemia, unspecified: Secondary | ICD-10-CM | POA: Diagnosis not present

## 2023-05-07 DIAGNOSIS — R052 Subacute cough: Secondary | ICD-10-CM | POA: Diagnosis not present

## 2023-05-07 DIAGNOSIS — R739 Hyperglycemia, unspecified: Secondary | ICD-10-CM

## 2023-05-07 DIAGNOSIS — F321 Major depressive disorder, single episode, moderate: Secondary | ICD-10-CM | POA: Diagnosis not present

## 2023-05-07 DIAGNOSIS — R059 Cough, unspecified: Secondary | ICD-10-CM | POA: Diagnosis not present

## 2023-05-07 DIAGNOSIS — Z0001 Encounter for general adult medical examination with abnormal findings: Secondary | ICD-10-CM

## 2023-05-07 LAB — COMPREHENSIVE METABOLIC PANEL
ALT: 34 U/L (ref 0–35)
AST: 23 U/L (ref 0–37)
Albumin: 4 g/dL (ref 3.5–5.2)
Alkaline Phosphatase: 55 U/L (ref 39–117)
BUN: 14 mg/dL (ref 6–23)
CO2: 28 mEq/L (ref 19–32)
Calcium: 9.3 mg/dL (ref 8.4–10.5)
Chloride: 101 mEq/L (ref 96–112)
Creatinine, Ser: 0.85 mg/dL (ref 0.40–1.20)
GFR: 78.01 mL/min (ref >60.00)
Glucose, Bld: 107 mg/dL — ABNORMAL HIGH (ref 70–99)
Potassium: 4.6 mEq/L (ref 3.5–5.1)
Sodium: 137 mEq/L (ref 135–145)
Total Bilirubin: 0.4 mg/dL (ref 0.2–1.2)
Total Protein: 7 g/dL (ref 6.0–8.3)

## 2023-05-07 LAB — CBC WITH DIFFERENTIAL/PLATELET
Basophils Absolute: 0 10*3/uL (ref 0.0–0.1)
Basophils Relative: 0.6 % (ref 0.0–3.0)
Eosinophils Absolute: 0.2 10*3/uL (ref 0.0–0.7)
Eosinophils Relative: 2.5 % (ref 0.0–5.0)
HCT: 40.3 % (ref 36.0–46.0)
Hemoglobin: 13.7 g/dL (ref 12.0–15.0)
Lymphocytes Relative: 24.2 % (ref 12.0–46.0)
Lymphs Abs: 1.6 10*3/uL (ref 0.7–4.0)
MCHC: 34.1 g/dL (ref 30.0–36.0)
MCV: 93.7 fl (ref 78.0–100.0)
Monocytes Absolute: 0.6 10*3/uL (ref 0.1–1.0)
Monocytes Relative: 8.8 % (ref 3.0–12.0)
Neutro Abs: 4.1 10*3/uL (ref 1.4–7.7)
Neutrophils Relative %: 63.9 % (ref 43.0–77.0)
Platelets: 355 10*3/uL (ref 150.0–400.0)
RBC: 4.29 Mil/uL (ref 3.87–5.11)
RDW: 13.4 % (ref 11.5–15.5)
WBC: 6.4 10*3/uL (ref 4.0–10.5)

## 2023-05-07 LAB — LIPID PANEL
Cholesterol: 214 mg/dL — ABNORMAL HIGH (ref 0–200)
HDL: 61.7 mg/dL (ref >39.00)
LDL Cholesterol: 120 mg/dL — ABNORMAL HIGH (ref 0–99)
NonHDL: 152.3
Total CHOL/HDL Ratio: 3
Triglycerides: 160 mg/dL — ABNORMAL HIGH (ref 0.0–149.0)
VLDL: 32 mg/dL (ref 0.0–40.0)

## 2023-05-07 LAB — TSH: TSH: 4.16 u[IU]/mL (ref 0.35–5.50)

## 2023-05-07 LAB — HEMOGLOBIN A1C: Hgb A1c MFr Bld: 5.7 % (ref 4.6–6.5)

## 2023-05-07 LAB — VITAMIN D 25 HYDROXY (VIT D DEFICIENCY, FRACTURES): VITD: 23.42 ng/mL — ABNORMAL LOW (ref 30.00–100.00)

## 2023-05-07 NOTE — Progress Notes (Unsigned)
Subjective:    Patient ID: Betty Hines, female    DOB: 1971-01-16, 52 y.o.   MRN: [id number]  DOS:  05/07/2023 Type of visit - description: CPX  Here for CPX Had COVID few weeks ago, overall feels better except for mild persisting cough mild SOB.  PHQ-9 was positive, she admits that has been feeling very sad and anxious since she had COVID. Things at home are fine, her family is okay.  There is really no reason for it. Denies suicidal ideas.   Review of Systems See above   Past Medical History:  Diagnosis Date   Allergy    seasonal   Anemia    Anxiety    Depression    Hyperlipidemia     Past Surgical History:  Procedure Laterality Date   CERVICAL CERCLAGE     CESAREAN SECTION     x2   COLONOSCOPY     2007   WISDOM TOOTH EXTRACTION      Current Outpatient Medications  Medication Instructions   brompheniramine-pseudoephedrine-DM 30-2-10 MG/5ML syrup 5 mLs, Oral, 3 times daily PRN   cholecalciferol (VITAMIN D3) 2,000 Units, Oral, Daily   fluticasone (FLONASE) 50 MCG/ACT nasal spray 2 sprays, Each Nare, Daily   Green Tea, Camellia sinensis, (GREEN TEA PO) Oral, Daily, Take 2 soft gel capsules daily   Multiple Vitamin (MULTIVITAMIN) LIQD 5 mLs, Oral, Daily   Omega-3 Fatty Acids (FISH OIL PO) Oral, Daily       Objective:   Physical Exam BP 126/82   Pulse 86   Temp 98.1 F (36.7 C) (Oral)   Resp 16   Ht 5\' 5"  (1.651 m)   Wt 220 lb 8 oz (100 kg)   SpO2 98%   BMI 36.69 kg/m  General: Well developed, NAD, BMI noted Neck: + Not tender thyromegaly HEENT:  Normocephalic . Face symmetric, atraumatic Lungs:  CTA B Normal respiratory effort, no intercostal retractions, no accessory muscle use. Heart: RRR,  no murmur.  Abdomen:  Not distended, soft, non-tender. No rebound or rigidity.   Lower extremities: no pretibial edema bilaterally  Skin: Exposed areas without rash. Not pale. Not jaundice Neurologic:  alert & oriented X3.  Speech normal, gait  appropriate for age and unassisted Strength symmetric and appropriate for age.  Psych: Cognition and judgment appear intact.  Cooperative with normal attention span and concentration.  Tearful during part of the visit     Assessment    ASSESSMENT Hyperglycemia (A1c 5.8) Thyroid nodule Vitamin D deficiency BC: husband vasectomy  PLAN Here for CPX, see separate documentation. Depression: New issue, PHQ-9: 21.   Sxs started few weeks ago after she had COVID.  No clear-cut triggers, things at home and work are okay.  Listening therapy provided, declines medication for now.  Recommend to see a counselor, information provided, she is open to the idea.  If she develops suicidal ideas knows to seek medical attention.  Reassess in 6 weeks.  Sooner if needed.  Hyperglycemia: Checking A1c Thyroid nodule: Recent ultrasound was stable.  Checking labs. Vit d deficiency: On OTCs, checking labs. RTC 6 weeks  -Td  07-2019 -Vaccines I recommend: Shingrix, flu shot every fall.  Covid booster is an option  - Female care: per gyn, PAP 2021 ([facility]),   last  MMG 01/2023 ([facility])  - Bones: rec vit D. Perimenopausal ; no need for dexa at this time  --CCS: Cscope 2007, normal, C-scope 04/2022.  Next per GI - Diet exercise discussed - Labs: CMP,  FLP, CBC, iron, ferritin, A1c, TSH, T3, T4, vit d    ====10 Hyperglycemia: Last A1c 5.8 Iron deficiency anemia: Last ferritin was quite low, had a colonoscopy 04/2022, + polyps, next per GI.  Unable to take iron supplements due to his constipation, has changed his diet , lots of iron-rich foods. We will check labs, depending on results consider to take iron twice weekly. Thyroid nodule: [place] recommended, not done d/t cost.  Patient is planning to proceed at some point Vitamin D deficiency: Had ergocalciferol, not taking supplements, rec to start OTC 2000 units and check labs. Vaccines: Recommend flu and COVID booster. RTC 5 months CPX

## 2023-05-07 NOTE — Patient Instructions (Addendum)
Please see a counselor, is very important you express your emotions. Let me know if you change your mind and would like to try medication. Call if her symptoms are increasing. Seek help if you develop suicidal ideas.  Vaccines I recommend: Covid booster Shingrix (shingles) Flu shot every fall  GO TO THE LAB : Get the blood work     GO TO THE FRONT DESK, PLEASE SCHEDULE YOUR APPOINTMENTS Come back for a checkup in 6 weeks

## 2023-05-08 ENCOUNTER — Encounter: Payer: Self-pay | Admitting: Internal Medicine

## 2023-05-08 NOTE — Assessment & Plan Note (Signed)
-  Td  07-2019 -Vaccines I recommend: Shingrix, flu shot every fall.  Covid booster is an option - Female care: per gyn, PAP 2021 ([facility]),   last  MMG 01/2023 ([facility])  - Bones: rec vit D.  The patient is perimenopausal, no indication for DEXA at this time. --CCS: Cscope 2007, normal, C-scope 04/2022.  Next per GI - Diet exercise discussed - Labs: CMP, FLP, CBC, iron, ferritin, A1c, TSH, T3, T4, vit d

## 2023-05-08 NOTE — Assessment & Plan Note (Signed)
Here for CPX, see separate documentation. Depression: New issue, PHQ-9: 21.   Sxs started few weeks ago after she had COVID.  No clear-cut triggers, things at home and work are okay.  Listening therapy provided, declines medication for now.  Recommend to see a counselor, information provided, she is open to the idea.  If she develops suicidal ideas knows to seek medical attention.  Reassess in 6 weeks.  Sooner if needed. Hyperglycemia: Checking A1c Thyroid nodule: Recent ultrasound was stable.  Checking labs. Vit d deficiency: On OTCs, checking labs. RTC 6 weeks

## 2023-05-09 MED ORDER — VITAMIN D (ERGOCALCIFEROL) 1.25 MG (50000 UNIT) PO CAPS: 50000.0000 [IU] | ORAL_CAPSULE | ORAL | 0 refills | Status: DC

## 2023-05-09 NOTE — Addendum Note (Signed)
Addended byConrad Dearing D on: 05/09/2023 09:05 AM   Modules accepted: Orders

## 2023-06-03 DIAGNOSIS — N950 Postmenopausal bleeding: Secondary | ICD-10-CM | POA: Diagnosis not present

## 2023-06-03 DIAGNOSIS — N939 Abnormal uterine and vaginal bleeding, unspecified: Secondary | ICD-10-CM | POA: Diagnosis not present

## 2023-06-03 DIAGNOSIS — R300 Dysuria: Secondary | ICD-10-CM | POA: Diagnosis not present

## 2023-06-18 ENCOUNTER — Ambulatory Visit: Payer: BC Managed Care – PPO | Admitting: Internal Medicine

## 2023-09-12 ENCOUNTER — Other Ambulatory Visit: Payer: Self-pay | Admitting: Internal Medicine

## 2023-09-21 ENCOUNTER — Other Ambulatory Visit: Payer: Self-pay | Admitting: Internal Medicine

## 2023-09-30 ENCOUNTER — Other Ambulatory Visit: Payer: Self-pay | Admitting: Internal Medicine

## 2023-09-30 ENCOUNTER — Encounter: Payer: Self-pay | Admitting: Internal Medicine

## 2023-12-15 ENCOUNTER — Other Ambulatory Visit: Payer: Self-pay | Admitting: Family

## 2024-02-05 ENCOUNTER — Ambulatory Visit
Admission: RE | Admit: 2024-02-05 | Discharge: 2024-02-05 | Disposition: A | Discharge status: Home / Self Care | Payer: Managed Care, Other (non HMO) | Source: Ambulatory Visit | Attending: Family Medicine | Admitting: Family Medicine

## 2024-02-05 VITALS — BP 141/96 | HR 92 | Temp 97.9°F | Resp 18

## 2024-02-05 DIAGNOSIS — R6889 Other general symptoms and signs: Secondary | ICD-10-CM

## 2024-02-05 DIAGNOSIS — B349 Viral infection, unspecified: Secondary | ICD-10-CM

## 2024-02-05 LAB — POC COVID19/FLU A&B COMBO
Covid Antigen, POC: NEGATIVE
Influenza A Antigen, POC: NEGATIVE
Influenza B Antigen, POC: NEGATIVE

## 2024-02-05 LAB — POCT RAPID STREP A (OFFICE): Rapid Strep A Screen: NEGATIVE

## 2024-02-05 MED ORDER — PROMETHAZINE-DM 6.25-15 MG/5ML PO SYRP
5.0000 mL | ORAL_SOLUTION | Freq: Three times a day (TID) | ORAL | 0 refills | Status: DC | PRN
Start: 2024-02-05 — End: 2024-05-07

## 2024-02-05 NOTE — ED Triage Notes (Signed)
Sore throat (feels like swallowing glass), headache, diarrhea, vomiting, fatigue. - Entered by patient.   Symptoms onset 02/03/24 after Superbowl Sunday. Vomiting and diarrhea stopped on day 2.  Still has productive cough. Still coughing up green mucus.

## 2024-02-05 NOTE — ED Provider Notes (Signed)
[name]    CSN: [id number] Arrival date & time: 02/05/24  0854      History   Chief Complaint Chief Complaint  Patient presents with   Cough    Sore throat (feels like swallowing glass), headache, diarrhea, vomiting, fatigue. - Entered by patient    HPI Betty Hines is a 53 y.o. female.   Patient is today with a 4-day history of headache, 1 day history of diarrhea vomiting and fatigue which occurred 2 days ago, sore throat.  Patient endorses feeling feverish although did not measure her temperature.  Known direct sick exposures.  She reports most of her symptoms have resolved except for sore throat and cough.  Endorses that the cough is occasionally productive and is keeping her awake at night.  Has no history of asthma or chronic lung disease.  Past Medical History:  Diagnosis Date   Allergy    seasonal   Anemia    Anxiety    Depression    Hyperlipidemia     Patient Active Problem List   Diagnosis Date Noted   Hyperglycemia, unspecified 09/23/2022   PCP NOTES >>>>>>>>>>>>>>>>>> 03/02/2017   Annual physical exam 08/27/2011   COLONOSCOPY, HX OF 08/10/2007    Past Surgical History:  Procedure Laterality Date   CERVICAL CERCLAGE     CESAREAN SECTION     x2   COLONOSCOPY     2007   WISDOM TOOTH EXTRACTION      OB History   No obstetric history on file.      Home Medications    Prior to Admission medications   Medication Sig Start Date End Date Taking? Authorizing Provider  promethazine-dextromethorphan (PROMETHAZINE-DM) 6.25-15 MG/5ML syrup Take 5 mLs by mouth 3 (three) times daily as needed for cough. 02/05/24  Yes Bing Neighbors, NP  cholecalciferol (VITAMIN D3) 25 MCG (1000 UNIT) tablet Take 2,000 Units by mouth daily.    [provider]  [place] Si Tea, Camellia sinensis, (GREEN TEA PO) Take by mouth daily. Take 2 soft gel capsules daily    [provider]  Multiple Vitamin (MULTIVITAMIN) LIQD Take 5 mLs by mouth  daily.    [provider]  Omega-3 Fatty Acids (FISH OIL PO) Take by mouth daily.    [provider]  Vitamin D, Ergocalciferol, (DRISDOL) 1.25 MG (50000 UNIT) CAPS capsule Take 1 capsule by mouth once a week 09/21/23   Eulis Foster, FNP    Family History Family History  Adopted: Yes  Problem Relation Age of Onset   Colon cancer Other        2 aunts and 1 uncle , youngest index case??   Breast cancer Neg Hx    Coronary artery disease Neg Hx    Diabetes Neg Hx    Esophageal cancer Neg Hx    Rectal cancer Neg Hx    Stomach cancer Neg Hx    Crohn's disease Neg Hx     Social History Social History   Tobacco Use   Smoking status: Never   Smokeless tobacco: Never  Vaping Use   Vaping status: Never Used  Substance Use Topics   Alcohol use: No   Drug use: No     Allergies   Penicillins   Review of Systems Review of Systems  Respiratory:  Positive for cough.      Physical Exam Triage Vital Signs ED Triage Vitals  Encounter Vitals Group     BP 02/05/24 0919 (!) 141/96  Systolic BP Percentile --      Diastolic BP Percentile --      Pulse Rate 02/05/24 0941 92     Resp 02/05/24 0919 18     Temp 02/05/24 0919 97.9 F (36.6 C)     Temp Source 02/05/24 0919 Oral     SpO2 02/05/24 0919 91 %     Weight --      Height --      Head Circumference --      Peak Flow --      Pain Score 02/05/24 0917 6     Pain Loc --      Pain Education --      Exclude from Growth Chart --    No data found.  Updated Vital Signs BP (!) 141/96 (BP Location: Left Arm)   Pulse 92   Temp 97.9 F (36.6 C) (Oral)   Resp 18   SpO2 95%   Visual Acuity Right Eye Distance:   Left Eye Distance:   Bilateral Distance:    Right Eye Near:   Left Eye Near:    Bilateral Near:     Physical Exam Vitals reviewed.  Constitutional:      General: She is not in acute distress.    Appearance: She is not ill-appearing.  HENT:     Head: Normocephalic and atraumatic.      Nose: No congestion or rhinorrhea.     Mouth/Throat:     Pharynx: Posterior oropharyngeal erythema present. No oropharyngeal exudate.  Eyes:     Extraocular Movements: Extraocular movements intact.     Pupils: Pupils are equal, round, and reactive to light.  Cardiovascular:     Rate and Rhythm: Normal rate and regular rhythm.  Pulmonary:     Effort: Pulmonary effort is normal.     Breath sounds: Normal breath sounds. No wheezing, rhonchi or rales.  Musculoskeletal:     Cervical back: Normal range of motion and neck supple.  Skin:    General: Skin is warm and dry.  Neurological:     General: No focal deficit present.     Mental Status: She is alert.      UC Treatments / Results  Labs (all labs ordered are listed, but only abnormal results are displayed) Labs Reviewed  POC COVID19/FLU A&B COMBO - Normal  POCT RAPID STREP A (OFFICE) - Normal    EKG   Radiology No results found.  Procedures Procedures (including critical care time)  Medications Ordered in UC Medications - No data to display  Initial Impression / Assessment and Plan / UC Course  I have reviewed the triage vital signs and the nursing notes.  Pertinent labs & imaging results that were available during my care of the patient were reviewed by me and considered in my medical decision making (see chart for details).    Suspect influenza like illness.  Symptoms appear to be resolving with the exception of cough and sore throat.  Patient had a negative rapid strep test and a negative COVID/flu test.  Symptom management indicated only with Promethazine DM for cough and encouraged to drink warm teas and hydrate well with fluids and of lingering cough.  Precautions given if symptoms worsen or do not improve. Final Clinical Impressions(s) / UC Diagnoses   Final diagnoses:  Viral illness  Influenza-like symptoms     Discharge Instructions      Sounds as if you may have had influenza given your symptoms.  For  management of  cough continue warm teas with honey and also prescribed you Promethazine DM which you can take up to 3 times daily as needed for cough.  Will also help with any congestion and soreness of throat caused by coughing.  Treated well with fluids..  As discussed I do recommend wearing a mask with airline travel.      ED Prescriptions     Medication Sig Dispense Auth. Provider   promethazine-dextromethorphan (PROMETHAZINE-DM) 6.25-15 MG/5ML syrup Take 5 mLs by mouth 3 (three) times daily as needed for cough. 180 mL Bing Neighbors, NP      PDMP not reviewed this encounter.   Bing Neighbors, NP 02/05/24 1014

## 2024-02-05 NOTE — Discharge Instructions (Signed)
Sounds as if you may have had influenza given your symptoms.  For management of cough continue warm teas with honey and also prescribed you Promethazine DM which you can take up to 3 times daily as needed for cough.  Will also help with any congestion and soreness of throat caused by coughing.  Treated well with fluids..  As discussed I do recommend wearing a mask with airline travel.

## 2024-03-23 LAB — HM MAMMOGRAPHY

## 2024-05-07 ENCOUNTER — Encounter: Payer: Self-pay | Admitting: Internal Medicine

## 2024-05-07 ENCOUNTER — Ambulatory Visit (INDEPENDENT_AMBULATORY_CARE_PROVIDER_SITE_OTHER): Payer: Managed Care, Other (non HMO) | Admitting: Internal Medicine

## 2024-05-07 VITALS — BP 136/86 | HR 83 | Temp 97.5°F | Resp 16 | Ht 65.0 in | Wt 216.8 lb

## 2024-05-07 DIAGNOSIS — E559 Vitamin D deficiency, unspecified: Secondary | ICD-10-CM

## 2024-05-07 DIAGNOSIS — R739 Hyperglycemia, unspecified: Secondary | ICD-10-CM

## 2024-05-07 DIAGNOSIS — Z0000 Encounter for general adult medical examination without abnormal findings: Secondary | ICD-10-CM | POA: Diagnosis not present

## 2024-05-07 NOTE — Progress Notes (Signed)
 Subjective:    Patient ID: Betty Hines, female    DOB: 06-24-1971, 53 y.o.   MRN: [id number]  DOS:  05/07/2024 Type of visit - description: CPX  Here for CPX. No major concerns. Last year she had some depression but that is largely resolved.   Review of Systems   A 14 point review of systems is negative     Past Medical History:  Diagnosis Date   Allergy    seasonal   Anemia    Anxiety    Depression    Hyperlipidemia     Past Surgical History:  Procedure Laterality Date   CERVICAL CERCLAGE     CESAREAN SECTION     x2   COLONOSCOPY     2007   WISDOM TOOTH EXTRACTION     Social History   Socioeconomic History   Marital status: Married    Spouse name: Not on file   Number of children: 2   Years of education: Not on file   Highest education level: Not on file  Occupational History   Occupation: HR , Field seismologist  Tobacco Use   Smoking status: Never   Smokeless tobacco: Never  Vaping Use   Vaping status: Never Used  Substance and Sexual Activity   Alcohol use: No   Drug use: No   Sexual activity: Yes    Comment: husband had vasectomy  Other Topics Concern   Not on file  Social History Narrative   Household pt  husband and son   1999, daughter, college [employer]   2002, son : [employer] optician , Automotive engineer    Social Drivers of Corporate investment banker Strain: Not on Ship broker Insecurity: Not on file  Transportation Needs: Not on file  Physical Activity: Not on file  Stress: Not on file  Social Connections: Not on file  Intimate Partner Violence: Not on file    Current Outpatient Medications  Medication Instructions   cholecalciferol (VITAMIN D3) 2,000 Units, Daily   Multiple Vitamin (MULTIVITAMIN) LIQD 5 mLs, Daily       Objective:   Physical Exam BP 136/86 (BP Location: Left Arm, Patient Position: Sitting, Cuff Size: Large)   Pulse 83   Temp (!) 97.5 F (36.4 C) (Oral)   Resp 16   Ht 5\' 5"  (1.651 m)   Wt 216 lb 12.8 oz (98.3  kg)   SpO2 98%   BMI 36.08 kg/m  General: Well developed, NAD, BMI noted Neck: Mild thyromegaly, no TTP.  No LAD's are I can tell. HEENT:  Normocephalic . Face symmetric, atraumatic Lungs:  CTA B Normal respiratory effort, no intercostal retractions, no accessory muscle use. Heart: RRR,  no murmur.  Abdomen:  Not distended, soft, non-tender. No rebound or rigidity.   Lower extremities: no pretibial edema bilaterally  Skin: Exposed areas without rash. Not pale. Not jaundice Neurologic:  alert & oriented X3.  Speech normal, gait appropriate for age and unassisted Strength symmetric and appropriate for age.  Psych: Cognition and judgment appear intact.  Cooperative with normal attention span and concentration.  Behavior appropriate. No anxious or depressed appearing.     Assessment     ASSESSMENT Hyperglycemia (A1c 5.8) Thyroid  nodule--US  04/2023 stable. Vitamin D  deficiency BC: husband vasectomy  PLAN Here for CPX - Td  07-2019 -Vaccines I recommend: Shingrix, flu shot every fall.    - Female care: per gyn, PAP 02/17/2023 ([facility]),   last  MMG 03/2024 ([facility])  - Bones: Menopause  started 2025.   DEXA should be at the 5-year mark of menopause.  Was offered HRT by gynecology, declined so far. --CCS: Cscope 2007, normal, C-scope 04/2022.  Next 5 years per GI letter - Diet discussed, recommend to cut down on carbohydrates.  Increase fruits and vegetables.  She has access to a nutritionist, encouraged to pursue.  Recommend to exercise 3 hours a week. - Labs: CMP FLP CBC A1c TSH vitamin D . Other issues: Depression: Was feeling depressed last year, talk to her counselor, no obvious triggers but she now feels great. Hyperglycemia: Checking labs Thyromegaly: Seems stable on clinical grounds Vitamin D  deficiency: Not taking supplements, encouraged to do 2000 units daily, checking labs. RTC 1 year.

## 2024-05-07 NOTE — Patient Instructions (Signed)
 Vaccines I recommend: Flu shot every fall Shingrix  Take vitamin D  over-the-counter 2000 units every day. If your levels are still low, will send a prescription for a stronger vitamin D  to take for 3 months  Try to exercise 3 hours a week Watch your diet  GO TO THE LAB : Get the blood work     Next office visit for a physical exam in 1 year.  Please make an appointment before you leave today

## 2024-05-08 LAB — COMPREHENSIVE METABOLIC PANEL WITH GFR
AG Ratio: 1.5 (calc) (ref 1.0–2.5)
ALT: 25 U/L (ref 6–29)
AST: 17 U/L (ref 10–35)
Albumin: 4.3 g/dL (ref 3.6–5.1)
Alkaline phosphatase (APISO): 53 U/L (ref 37–153)
BUN: 11 mg/dL (ref 7–25)
CO2: 30 mmol/L (ref 20–32)
Calcium: 9.4 mg/dL (ref 8.6–10.4)
Chloride: 102 mmol/L (ref 98–110)
Creat: 0.91 mg/dL (ref 0.50–1.03)
Globulin: 2.9 g/dL (ref 1.9–3.7)
Glucose, Bld: 109 mg/dL — ABNORMAL HIGH (ref 65–99)
Potassium: 3.9 mmol/L (ref 3.5–5.3)
Sodium: 140 mmol/L (ref 135–146)
Total Bilirubin: 0.4 mg/dL (ref 0.2–1.2)
Total Protein: 7.2 g/dL (ref 6.1–8.1)
eGFR: 75 mL/min/{1.73_m2} (ref >=60)

## 2024-05-08 LAB — CBC WITH DIFFERENTIAL/PLATELET
Absolute Lymphocytes: 1792 {cells}/uL (ref 850–3900)
Absolute Monocytes: 531 {cells}/uL (ref 200–950)
Basophils Absolute: 51 {cells}/uL (ref 0–200)
Basophils Relative: 0.8 %
Eosinophils Absolute: 122 {cells}/uL (ref 15–500)
Eosinophils Relative: 1.9 %
HCT: 39.2 % (ref 35.0–45.0)
Hemoglobin: 13 g/dL (ref 11.7–15.5)
MCH: 30.8 pg (ref 27.0–33.0)
MCHC: 33.2 g/dL (ref 32.0–36.0)
MCV: 92.9 fL (ref 80.0–100.0)
MPV: 10.3 fL (ref 7.5–12.5)
Monocytes Relative: 8.3 %
Neutro Abs: 3904 {cells}/uL (ref 1500–7800)
Neutrophils Relative %: 61 %
Platelets: 299 10*3/uL (ref 140–400)
RBC: 4.22 10*6/uL (ref 3.80–5.10)
RDW: 12.6 % (ref 11.0–15.0)
Total Lymphocyte: 28 %
WBC: 6.4 10*3/uL (ref 3.8–10.8)

## 2024-05-08 LAB — LIPID PANEL
Cholesterol: 235 mg/dL — ABNORMAL HIGH (ref <200)
HDL: 69 mg/dL (ref >=50)
LDL Cholesterol (Calc): 122 mg/dL — ABNORMAL HIGH
Non-HDL Cholesterol (Calc): 166 mg/dL — ABNORMAL HIGH (ref <130)
Total CHOL/HDL Ratio: 3.4 (calc) (ref <5.0)
Triglycerides: 283 mg/dL — ABNORMAL HIGH (ref <150)

## 2024-05-08 LAB — HEMOGLOBIN A1C
Hgb A1c MFr Bld: 5.8 % — ABNORMAL HIGH (ref <5.7)
Mean Plasma Glucose: 120 mg/dL
eAG (mmol/L): 6.6 mmol/L

## 2024-05-08 LAB — TSH: TSH: 2.4 m[IU]/L

## 2024-05-08 LAB — VITAMIN D 25 HYDROXY (VIT D DEFICIENCY, FRACTURES): Vit D, 25-Hydroxy: 33 ng/mL (ref 30–100)

## 2024-05-10 ENCOUNTER — Encounter: Payer: Self-pay | Admitting: Internal Medicine

## 2024-05-10 NOTE — Assessment & Plan Note (Signed)
 Here for CPX  Other issues: Depression: Was feeling depressed last year, talk to her counselor, no obvious triggers but she now feels great. Hyperglycemia: Checking labs Thyromegaly: Seems stable on clinical grounds Vitamin D  deficiency: Not taking supplements, encouraged to do 2000 units daily, checking labs. RTC 1 year.

## 2024-05-10 NOTE — Assessment & Plan Note (Signed)
 Here for CPX - Td  07-2019 -Vaccines I recommend: Shingrix, flu shot every fall.    - Female care: per gyn, PAP 02/17/2023 ([facility]),   last  MMG 03/2024 ([facility])  - Bones: Menopause started 2025.   DEXA should be at the 5-year mark of menopause.  Was offered HRT by gynecology, declined so far. --CCS: Cscope 2007, normal, C-scope 04/2022.  Next 5 years per GI letter - Diet discussed, recommend to cut down on carbohydrates.  Increase fruits and vegetables.  She has access to a nutritionist, encouraged to pursue.  Recommend to exercise 3 hours a week. - Labs: CMP FLP CBC A1c TSH vitamin D .

## 2024-05-11 ENCOUNTER — Ambulatory Visit: Payer: Self-pay | Admitting: Internal Medicine
